# Patient Record
Sex: Female | Born: 1975
Health system: Southern US, Community
[De-identification: ages and names within clinical notes are randomized; demographics above are authoritative.]

## PROBLEM LIST (undated history)

## (undated) DIAGNOSIS — G8220 Paraplegia, unspecified: Secondary | ICD-10-CM

## (undated) DIAGNOSIS — G43909 Migraine, unspecified, not intractable, without status migrainosus: Secondary | ICD-10-CM

## (undated) DIAGNOSIS — N3289 Other specified disorders of bladder: Secondary | ICD-10-CM

## (undated) DIAGNOSIS — R51 Headache: Secondary | ICD-10-CM

## (undated) DIAGNOSIS — Z8614 Personal history of Methicillin resistant Staphylococcus aureus infection: Secondary | ICD-10-CM

## (undated) DIAGNOSIS — K592 Neurogenic bowel, not elsewhere classified: Secondary | ICD-10-CM

## (undated) DIAGNOSIS — I10 Essential (primary) hypertension: Secondary | ICD-10-CM

## (undated) DIAGNOSIS — R200 Anesthesia of skin: Secondary | ICD-10-CM

## (undated) DIAGNOSIS — F32A Depression, unspecified: Secondary | ICD-10-CM

## (undated) DIAGNOSIS — IMO0002 Reserved for concepts with insufficient information to code with codable children: Secondary | ICD-10-CM

## (undated) DIAGNOSIS — F329 Major depressive disorder, single episode, unspecified: Secondary | ICD-10-CM

## (undated) DIAGNOSIS — R609 Edema, unspecified: Secondary | ICD-10-CM

## (undated) DIAGNOSIS — M549 Dorsalgia, unspecified: Secondary | ICD-10-CM

## (undated) DIAGNOSIS — R202 Paresthesia of skin: Secondary | ICD-10-CM

## (undated) DIAGNOSIS — G039 Meningitis, unspecified: Secondary | ICD-10-CM

## (undated) DIAGNOSIS — D509 Iron deficiency anemia, unspecified: Secondary | ICD-10-CM

## (undated) DIAGNOSIS — F1120 Opioid dependence, uncomplicated: Secondary | ICD-10-CM

## (undated) DIAGNOSIS — R600 Localized edema: Secondary | ICD-10-CM

## (undated) DIAGNOSIS — G8929 Other chronic pain: Secondary | ICD-10-CM

## (undated) DIAGNOSIS — Z789 Other specified health status: Secondary | ICD-10-CM

## (undated) DIAGNOSIS — S0300XA Dislocation of jaw, unspecified side, initial encounter: Secondary | ICD-10-CM

## (undated) DIAGNOSIS — N319 Neuromuscular dysfunction of bladder, unspecified: Secondary | ICD-10-CM

## (undated) DIAGNOSIS — Z87898 Personal history of other specified conditions: Secondary | ICD-10-CM

## (undated) DIAGNOSIS — R339 Retention of urine, unspecified: Secondary | ICD-10-CM

## (undated) DIAGNOSIS — M542 Cervicalgia: Secondary | ICD-10-CM

## (undated) DIAGNOSIS — G4700 Insomnia, unspecified: Secondary | ICD-10-CM

## (undated) DIAGNOSIS — N3941 Urge incontinence: Secondary | ICD-10-CM

## (undated) HISTORY — PX: OTHER SURGICAL HISTORY: SHX169

---

## 1997-10-27 ENCOUNTER — Other Ambulatory Visit: Admission: RE | Admit: 1997-10-27 | Discharge: 1997-10-27 | Payer: Self-pay | Admitting: Gynecology

## 1999-01-27 ENCOUNTER — Other Ambulatory Visit: Admission: RE | Admit: 1999-01-27 | Discharge: 1999-01-27 | Payer: Self-pay | Admitting: Gynecology

## 2000-01-31 ENCOUNTER — Other Ambulatory Visit: Admission: RE | Admit: 2000-01-31 | Discharge: 2000-01-31 | Payer: Self-pay | Admitting: Gynecology

## 2000-01-31 ENCOUNTER — Other Ambulatory Visit: Admission: RE | Admit: 2000-01-31 | Discharge: 2000-01-31 | Payer: Self-pay | Admitting: Obstetrics and Gynecology

## 2001-03-10 ENCOUNTER — Other Ambulatory Visit: Admission: RE | Admit: 2001-03-10 | Discharge: 2001-03-10 | Payer: Self-pay | Admitting: Obstetrics and Gynecology

## 2002-04-07 ENCOUNTER — Other Ambulatory Visit: Admission: RE | Admit: 2002-04-07 | Discharge: 2002-04-07 | Payer: Self-pay | Admitting: Obstetrics and Gynecology

## 2002-04-13 ENCOUNTER — Encounter: Payer: Self-pay | Admitting: Obstetrics and Gynecology

## 2002-04-13 ENCOUNTER — Encounter: Admission: RE | Admit: 2002-04-13 | Discharge: 2002-04-13 | Payer: Self-pay | Admitting: Obstetrics and Gynecology

## 2002-10-06 ENCOUNTER — Other Ambulatory Visit: Admission: RE | Admit: 2002-10-06 | Discharge: 2002-10-06 | Payer: Self-pay | Admitting: Obstetrics and Gynecology

## 2002-10-14 ENCOUNTER — Other Ambulatory Visit: Admission: RE | Admit: 2002-10-14 | Discharge: 2002-10-14 | Payer: Self-pay | Admitting: Obstetrics and Gynecology

## 2003-05-25 ENCOUNTER — Other Ambulatory Visit: Admission: RE | Admit: 2003-05-25 | Discharge: 2003-05-25 | Payer: Self-pay | Admitting: Obstetrics and Gynecology

## 2003-06-03 ENCOUNTER — Inpatient Hospital Stay (HOSPITAL_COMMUNITY): Admission: AD | Admit: 2003-06-03 | Discharge: 2003-06-03 | Payer: Self-pay | Admitting: Obstetrics & Gynecology

## 2003-06-09 ENCOUNTER — Inpatient Hospital Stay (HOSPITAL_COMMUNITY): Admission: AD | Admit: 2003-06-09 | Discharge: 2003-06-09 | Payer: Self-pay | Admitting: Obstetrics and Gynecology

## 2003-06-19 DIAGNOSIS — G8220 Paraplegia, unspecified: Secondary | ICD-10-CM

## 2003-06-19 DIAGNOSIS — N319 Neuromuscular dysfunction of bladder, unspecified: Secondary | ICD-10-CM

## 2003-06-19 DIAGNOSIS — G8929 Other chronic pain: Secondary | ICD-10-CM

## 2003-06-19 DIAGNOSIS — Z8614 Personal history of Methicillin resistant Staphylococcus aureus infection: Secondary | ICD-10-CM

## 2003-06-19 DIAGNOSIS — K592 Neurogenic bowel, not elsewhere classified: Secondary | ICD-10-CM

## 2003-06-19 DIAGNOSIS — IMO0002 Reserved for concepts with insufficient information to code with codable children: Secondary | ICD-10-CM

## 2003-06-19 DIAGNOSIS — R200 Anesthesia of skin: Secondary | ICD-10-CM

## 2003-06-19 DIAGNOSIS — N3941 Urge incontinence: Secondary | ICD-10-CM

## 2003-06-19 HISTORY — DX: Anesthesia of skin: R20.0

## 2003-06-19 HISTORY — DX: Neuromuscular dysfunction of bladder, unspecified: N31.9

## 2003-06-19 HISTORY — DX: Reserved for concepts with insufficient information to code with codable children: IMO0002

## 2003-06-19 HISTORY — PX: OTHER SURGICAL HISTORY: SHX169

## 2003-06-19 HISTORY — DX: Urge incontinence: N39.41

## 2003-06-19 HISTORY — DX: Personal history of Methicillin resistant Staphylococcus aureus infection: Z86.14

## 2003-06-19 HISTORY — DX: Other chronic pain: G89.29

## 2003-06-19 HISTORY — DX: Paraplegia, unspecified: G82.20

## 2003-06-19 HISTORY — DX: Neurogenic bowel, not elsewhere classified: K59.2

## 2003-06-30 ENCOUNTER — Inpatient Hospital Stay (HOSPITAL_COMMUNITY): Admission: AD | Admit: 2003-06-30 | Discharge: 2003-06-30 | Payer: Self-pay | Admitting: Obstetrics and Gynecology

## 2003-07-15 ENCOUNTER — Encounter: Admission: RE | Admit: 2003-07-15 | Discharge: 2003-10-13 | Payer: Self-pay | Admitting: Obstetrics and Gynecology

## 2003-07-17 ENCOUNTER — Inpatient Hospital Stay (HOSPITAL_COMMUNITY): Admission: AD | Admit: 2003-07-17 | Discharge: 2003-07-19 | Payer: Self-pay | Admitting: Obstetrics and Gynecology

## 2003-10-06 ENCOUNTER — Other Ambulatory Visit: Admission: RE | Admit: 2003-10-06 | Discharge: 2003-10-06 | Payer: Self-pay | Admitting: Obstetrics and Gynecology

## 2003-10-28 ENCOUNTER — Encounter: Admission: RE | Admit: 2003-10-28 | Discharge: 2003-10-28 | Payer: Self-pay | Admitting: Obstetrics and Gynecology

## 2003-11-03 ENCOUNTER — Inpatient Hospital Stay (HOSPITAL_COMMUNITY): Admission: AD | Admit: 2003-11-03 | Discharge: 2003-11-03 | Payer: Self-pay | Admitting: Obstetrics and Gynecology

## 2003-11-03 ENCOUNTER — Encounter: Admission: RE | Admit: 2003-11-03 | Discharge: 2003-11-03 | Payer: Self-pay | Admitting: Obstetrics and Gynecology

## 2003-11-09 ENCOUNTER — Inpatient Hospital Stay (HOSPITAL_COMMUNITY): Admission: AD | Admit: 2003-11-09 | Discharge: 2003-11-09 | Payer: Self-pay | Admitting: Obstetrics and Gynecology

## 2003-11-10 ENCOUNTER — Encounter: Admission: RE | Admit: 2003-11-10 | Discharge: 2003-11-10 | Payer: Self-pay | Admitting: Obstetrics and Gynecology

## 2003-11-17 ENCOUNTER — Encounter: Admission: RE | Admit: 2003-11-17 | Discharge: 2003-11-17 | Payer: Self-pay | Admitting: Obstetrics and Gynecology

## 2003-11-20 ENCOUNTER — Inpatient Hospital Stay (HOSPITAL_COMMUNITY): Admission: AD | Admit: 2003-11-20 | Discharge: 2003-12-08 | Payer: Self-pay | Admitting: Obstetrics and Gynecology

## 2003-11-24 ENCOUNTER — Encounter: Payer: Self-pay | Admitting: Neurosurgery

## 2003-11-24 ENCOUNTER — Encounter (INDEPENDENT_AMBULATORY_CARE_PROVIDER_SITE_OTHER): Payer: Self-pay | Admitting: *Deleted

## 2003-11-25 ENCOUNTER — Encounter (INDEPENDENT_AMBULATORY_CARE_PROVIDER_SITE_OTHER): Payer: Self-pay | Admitting: *Deleted

## 2003-11-25 ENCOUNTER — Encounter: Payer: Self-pay | Admitting: Neurology

## 2003-11-25 HISTORY — PX: THORACIC LAMINECTOMY: SHX96

## 2003-11-27 ENCOUNTER — Encounter: Admission: RE | Admit: 2003-11-27 | Discharge: 2003-12-27 | Payer: Self-pay | Admitting: Obstetrics and Gynecology

## 2003-12-14 ENCOUNTER — Inpatient Hospital Stay (HOSPITAL_COMMUNITY): Admission: AD | Admit: 2003-12-14 | Discharge: 2003-12-27 | Payer: Self-pay | Admitting: Neurological Surgery

## 2003-12-27 ENCOUNTER — Inpatient Hospital Stay (HOSPITAL_COMMUNITY)
Admission: RE | Admit: 2003-12-27 | Discharge: 2004-01-25 | Payer: Self-pay | Admitting: Physical Medicine & Rehabilitation

## 2004-02-06 ENCOUNTER — Emergency Department (HOSPITAL_COMMUNITY): Admission: EM | Admit: 2004-02-06 | Discharge: 2004-02-06 | Payer: Self-pay | Admitting: Emergency Medicine

## 2004-02-07 ENCOUNTER — Inpatient Hospital Stay (HOSPITAL_COMMUNITY): Admission: EM | Admit: 2004-02-07 | Discharge: 2004-02-10 | Payer: Self-pay | Admitting: Emergency Medicine

## 2004-02-08 ENCOUNTER — Encounter (INDEPENDENT_AMBULATORY_CARE_PROVIDER_SITE_OTHER): Payer: Self-pay | Admitting: Cardiology

## 2004-02-15 ENCOUNTER — Other Ambulatory Visit: Admission: RE | Admit: 2004-02-15 | Discharge: 2004-02-15 | Payer: Self-pay | Admitting: Obstetrics and Gynecology

## 2004-02-29 ENCOUNTER — Encounter: Admission: RE | Admit: 2004-02-29 | Discharge: 2004-02-29 | Payer: Self-pay | Admitting: Neurosurgery

## 2004-03-02 ENCOUNTER — Encounter
Admission: RE | Admit: 2004-03-02 | Discharge: 2004-05-02 | Payer: Self-pay | Admitting: Physical Medicine & Rehabilitation

## 2004-03-03 ENCOUNTER — Ambulatory Visit: Payer: Self-pay | Admitting: Physical Medicine & Rehabilitation

## 2004-05-02 ENCOUNTER — Encounter
Admission: RE | Admit: 2004-05-02 | Discharge: 2004-07-31 | Payer: Self-pay | Admitting: Physical Medicine & Rehabilitation

## 2004-05-14 ENCOUNTER — Encounter: Admission: RE | Admit: 2004-05-14 | Discharge: 2004-05-14 | Payer: Self-pay | Admitting: Internal Medicine

## 2004-06-18 DIAGNOSIS — Z87898 Personal history of other specified conditions: Secondary | ICD-10-CM

## 2004-06-18 HISTORY — DX: Personal history of other specified conditions: Z87.898

## 2004-07-20 ENCOUNTER — Emergency Department (HOSPITAL_COMMUNITY): Admission: EM | Admit: 2004-07-20 | Discharge: 2004-07-20 | Payer: Self-pay | Admitting: Emergency Medicine

## 2004-07-27 ENCOUNTER — Ambulatory Visit: Payer: Self-pay | Admitting: Physical Medicine & Rehabilitation

## 2004-10-23 ENCOUNTER — Encounter
Admission: RE | Admit: 2004-10-23 | Discharge: 2005-01-21 | Payer: Self-pay | Admitting: Physical Medicine & Rehabilitation

## 2004-10-25 ENCOUNTER — Ambulatory Visit: Payer: Self-pay | Admitting: Physical Medicine & Rehabilitation

## 2005-01-21 ENCOUNTER — Encounter: Admission: RE | Admit: 2005-01-21 | Discharge: 2005-01-21 | Payer: Self-pay | Admitting: Neurosurgery

## 2005-01-24 ENCOUNTER — Other Ambulatory Visit: Admission: RE | Admit: 2005-01-24 | Discharge: 2005-01-24 | Payer: Self-pay | Admitting: Family Medicine

## 2005-02-21 ENCOUNTER — Encounter
Admission: RE | Admit: 2005-02-21 | Discharge: 2005-05-22 | Payer: Self-pay | Admitting: Physical Medicine & Rehabilitation

## 2005-02-21 ENCOUNTER — Ambulatory Visit: Payer: Self-pay | Admitting: Physical Medicine & Rehabilitation

## 2005-03-27 ENCOUNTER — Ambulatory Visit (HOSPITAL_BASED_OUTPATIENT_CLINIC_OR_DEPARTMENT_OTHER): Admission: RE | Admit: 2005-03-27 | Discharge: 2005-03-27 | Payer: Self-pay | Admitting: Urology

## 2005-03-27 ENCOUNTER — Ambulatory Visit (HOSPITAL_COMMUNITY): Admission: RE | Admit: 2005-03-27 | Discharge: 2005-03-27 | Payer: Self-pay | Admitting: Urology

## 2005-05-22 ENCOUNTER — Encounter: Admission: RE | Admit: 2005-05-22 | Discharge: 2005-05-22 | Payer: Self-pay | Admitting: Neurology

## 2005-06-21 ENCOUNTER — Ambulatory Visit: Payer: Self-pay | Admitting: Physical Medicine & Rehabilitation

## 2005-06-21 ENCOUNTER — Encounter
Admission: RE | Admit: 2005-06-21 | Discharge: 2005-09-19 | Payer: Self-pay | Admitting: Physical Medicine & Rehabilitation

## 2005-10-16 ENCOUNTER — Encounter: Admission: RE | Admit: 2005-10-16 | Discharge: 2005-10-16 | Payer: Self-pay | Admitting: Neurology

## 2005-11-06 ENCOUNTER — Ambulatory Visit (HOSPITAL_BASED_OUTPATIENT_CLINIC_OR_DEPARTMENT_OTHER): Admission: RE | Admit: 2005-11-06 | Discharge: 2005-11-06 | Payer: Self-pay | Admitting: Urology

## 2005-11-15 ENCOUNTER — Ambulatory Visit: Payer: Self-pay | Admitting: Physical Medicine & Rehabilitation

## 2005-11-15 ENCOUNTER — Encounter
Admission: RE | Admit: 2005-11-15 | Discharge: 2006-02-13 | Payer: Self-pay | Admitting: Physical Medicine & Rehabilitation

## 2006-05-06 ENCOUNTER — Encounter
Admission: RE | Admit: 2006-05-06 | Discharge: 2006-08-04 | Payer: Self-pay | Admitting: Physical Medicine & Rehabilitation

## 2006-09-24 ENCOUNTER — Ambulatory Visit (HOSPITAL_BASED_OUTPATIENT_CLINIC_OR_DEPARTMENT_OTHER): Admission: RE | Admit: 2006-09-24 | Discharge: 2006-09-24 | Payer: Self-pay | Admitting: Urology

## 2007-05-01 ENCOUNTER — Other Ambulatory Visit: Admission: RE | Admit: 2007-05-01 | Discharge: 2007-05-01 | Payer: Self-pay | Admitting: Family Medicine

## 2008-01-08 ENCOUNTER — Ambulatory Visit (HOSPITAL_BASED_OUTPATIENT_CLINIC_OR_DEPARTMENT_OTHER): Admission: RE | Admit: 2008-01-08 | Discharge: 2008-01-08 | Payer: Self-pay | Admitting: Urology

## 2008-05-04 ENCOUNTER — Other Ambulatory Visit: Admission: RE | Admit: 2008-05-04 | Discharge: 2008-05-04 | Payer: Self-pay | Admitting: Family Medicine

## 2008-10-25 ENCOUNTER — Ambulatory Visit (HOSPITAL_BASED_OUTPATIENT_CLINIC_OR_DEPARTMENT_OTHER): Admission: RE | Admit: 2008-10-25 | Discharge: 2008-10-25 | Payer: Self-pay | Admitting: Urology

## 2009-01-04 ENCOUNTER — Encounter: Admission: RE | Admit: 2009-01-04 | Discharge: 2009-01-04 | Payer: Self-pay | Admitting: Psychiatry

## 2009-09-20 ENCOUNTER — Ambulatory Visit (HOSPITAL_BASED_OUTPATIENT_CLINIC_OR_DEPARTMENT_OTHER): Admission: RE | Admit: 2009-09-20 | Discharge: 2009-09-20 | Payer: Self-pay | Admitting: Urology

## 2009-10-21 ENCOUNTER — Ambulatory Visit (HOSPITAL_BASED_OUTPATIENT_CLINIC_OR_DEPARTMENT_OTHER): Admission: RE | Admit: 2009-10-21 | Discharge: 2009-10-21 | Payer: Self-pay | Admitting: Urology

## 2009-12-12 ENCOUNTER — Encounter: Admission: RE | Admit: 2009-12-12 | Discharge: 2009-12-12 | Payer: Self-pay | Admitting: Family Medicine

## 2010-07-09 ENCOUNTER — Encounter: Payer: Self-pay | Admitting: Obstetrics and Gynecology

## 2010-09-05 LAB — POCT HEMOGLOBIN-HEMACUE: Hemoglobin: 12.2 g/dL (ref 12.0–15.0)

## 2010-10-31 NOTE — Op Note (Signed)
Melissa Mills, Melissa Mills               ACCOUNT NO.:  [id number]   MEDICAL RECORD NO.:  [id number]          PATIENT TYPE:  AMB   LOCATION:  [facility]                         FACILITY:  [facility]   PHYSICIAN:  Martina Sinner, MD DATE OF BIRTH:  19-Jun-1975   DATE OF PROCEDURE:  DATE OF DISCHARGE:                               OPERATIVE REPORT   PREOPERATIVE DIAGNOSES:  Neurogenic bladder, refractory urge  incontinence.   POSTOPERATIVE DIAGNOSES:  Neurogenic bladder, refractory urge  incontinence.   SURGERY:  Cystoscopy, hydrodistention, Botox injection therapy.   [name] has a neurogenic bladder with refractory urge incontinence.  She responds beautifully to Botox 400 units.  She was prepped and draped  in the usual fashion.  Preoperative antibiotics were given.  ACMI scope  was utilized.  Bladder mucosa and trigone were normal.  There was no  stitch, foreign body or carcinoma.  Trigone was identified.  I injected  with the bladder partially full.  I injected 400 units of Botox in 40 mL  of saline.  I used my usual template.  I injected at 5 and 7 o'clock and  cephalad to the interureteric ridge in the bottom half of the bladder.  Procedure went very well.  There was no bleeding.  The bladder was  emptied.  The patient was taken to the recovery room.           ______________________________  Martina Sinner, MD  Electronically Signed     SAM/MEDQ  D:  10/25/2008  T:  10/25/2008  Job:  [id number]

## 2010-10-31 NOTE — Op Note (Signed)
[name], [name]               ACCOUNT NO.:  [id number]   MEDICAL RECORD NO.:  [id number]          PATIENT TYPE:  AMB   LOCATION:  [facility]                         FACILITY:  [facility]   PHYSICIAN:  Martina Sinner, MD DATE OF BIRTH:  [birth year]   DATE OF PROCEDURE:  DATE OF DISCHARGE:                               OPERATIVE REPORT   PREOPERATIVE DIAGNOSES:  1. Muscle spasm, neurogenic.  2. Bladder urge incontinence.   POSTOPERATIVE DIAGNOSES:  1. Muscle spasm, neurogenic.  2. Bladder urge incontinence.   PROCEDURE:  1. Cystoscopy.  2. Hydrodistention.  3. Injection of Botox.   PROCEDURE IN DETAIL:  Melissa Mills's neurogenic bladder  __________beautifully with Botox.  She has had over a 12 month benefit  in the last injection.   The patient was prepped and draped in the usual fashion.  She was given  preoperative antibiotics.  ACMI scope was utilized.  The bladder was  hydrodistended to 550 ml.  There was no glomerulations.  With the  bladder partially full, I injected 400 units of Botox and 40 ml in on  the saline using my usual template at 5 and 7 o'clock and cephalad to  the interureteric ridge.  There was no bleeding.  The bladder was empty  and the patient was taken to the recovery room in excellent condition.           ______________________________  Martina Sinner, MD  Electronically Signed     SAM/MEDQ  D:  01/08/2008  T:  01/08/2008  Job:  [id number]

## 2010-11-03 NOTE — Discharge Summary (Signed)
NAME:  Melissa Mills, Melissa Mills                         ACCOUNT NO.:  [id number]   MEDICAL RECORD NO.:  [id number]                   PATIENT TYPE:  INP   LOCATION:  3037                                 FACILITY:  [facility]   PHYSICIAN:  Reinaldo Meeker, M.D.              DATE OF BIRTH:  Sep 06, 1975   DATE OF ADMISSION:  12/14/2003  DATE OF DISCHARGE:  12/27/2003                                 DISCHARGE SUMMARY   PRIMARY DIAGNOSIS:  Thoracic wound infection.   PRIMARY OPERATIVE PROCEDURE:  Re-exploration of thoracic wound with  irrigation and debridement.   HISTORY:  Melissa Mills is a 35 year old female who was admitted at this time  with suspicion of a thoracic wound infection.  She has had surgery  approximately three weeks earlier for a thoracic subdural hematoma secondary  to HELLP syndrome after the delivery of her twins.  She had been improving  and gone to [facility], but had some draining of the wound.  She was therefore readmitted at this time for evaluation.  Dr. Marikay Alar  admitted the patient.  In his evaluation he felt that it was suspicious for  wound infection.  He took her to the operating room and did re-exploration  of the wound with irrigation and debridement, and reclosure.  Infectious  disease consultation was obtained.  Antibiotic recommendations were made and  these were started.  PICC line was placed as well.  Subsequently, the  patient was able to increase her activities as tolerated and appeared to be  doing well.  After approximately two weeks in the hospital, the patient  elected rather than return back to [facility] to continue in rehab at  [facility].  She is therefore transferred up to the  fourth floor for aggressive inpatient rehab and her condition is markedly  improved versus admission.                                                Reinaldo Meeker, M.D.    ROK/MEDQ  D:  02/24/2004  T:  02/25/2004  Job:  [id number]

## 2010-11-03 NOTE — Op Note (Signed)
Melissa Mills, Melissa Mills               ACCOUNT NO.:  [id number]   MEDICAL RECORD NO.:  [id number]          PATIENT TYPE:  AMB   LOCATION:  [facility]                         FACILITY:  [facility]   PHYSICIAN:  Martina Sinner, MD DATE OF BIRTH:  1975/12/29   DATE OF PROCEDURE:  11/06/2005  DATE OF DISCHARGE:  11/06/2005                                 OPERATIVE REPORT   PREOPERATIVE DIAGNOSIS:  Muscle spasm, neurogenic bladder, urge  incontinence.   OPERATION PERFORMED:  Cystoscopy, hydrodistention, Botox injection therapy.   INDICATIONS FOR PROCEDURE:  Melissa Mills has a neurogenic bladder.  She has  responded well to Botox therapy in the past.  She is now incontinent.   DESCRIPTION OF PROCEDURE:  The patient was prepped and draped in the usual  fashion.  The collagen injection scope was used for examination.  The  bladder mucosa and trigone were normal.  She was hydrodistended with 600 mL.  I then injected 200 mL of Botox instilled in 20 mL of saline throughout 20  sites of the bladder.  Using the usual template at 5 and 10 o'clock and in  the midline.  There was little to no bleeding.  The patient tolerated the  procedure very well.  The patient was taken to the recovery room, was  covered with perioperative antibiotics.           ______________________________  Martina Sinner, MD  Electronically Signed     SAM/MEDQ  D:  11/21/2005  T:  11/21/2005  Job:  [id number]

## 2010-11-03 NOTE — Assessment & Plan Note (Signed)
Melissa Mills returns to the clinic today for followup evaluation.  She comes  into the clinic and was filling out paper work in the office.  She  subsequently reported some vision changes, and her blood pressure was noted  to be elevated at 134/107, and a repeat reading of 144/116.  She reports  that she generally has periodic readings when her blood pressure is elevated  and other times when it is low.  She is really not concerned with that at  the present time.  We did place her in a room with the lights low, and she  reported that her vision changes resolved.  She reports that this occurs on  a periodic basis, and she has talked about it with Dr. Foy Mills, her primary  care physician.  The patient reports that her main problem in the office today is that she  continues to experience difficulty eating and this is all secondary to a  decreased taste that she has in her mouth.  She also reports that even plain  water has an abnormal taste.  She is convinced that she has some type of a  sinus infection.  Her primary care physician has sent her to Dr. Anne Mills a  local neurologist, and she apparently is scheduled for an MRI of her face  and head at [facility] today.  The patient reports that she previously was placed on Lasix which she took  approximately five days since August, when she was released from the  hospital.  She reports that that was when her blood pressure was elevated at  200/140.  I do have a note from her therapist that indicates that she is  making excellent progress.  She is apparently independent with transfers at  the present time.  She ambulate with a single-point cane with 1+ minimal  assist in 600 feet with one rest break on smooth surfaces.  She is able to  do straight leg raises on the left for 14 seconds and on the right for 26  seconds.  She is making excellent progress overall in terms of her mobility  and ADLs.  She requested a slip be sent for continued therapy as  her  insurance is interested in discontinuing therapy in the near future.  In terms of her bladder, she is doing in-and-out catheterization  approximately four times per day.  She does report that her intake of fluid  is less than optimal and that apparently has effected her bowels.  She  reports that she has had no bowel movement for the last one and a half weeks  despite taking a Fleet's enema and Enemeez.  She reports that she takes two  Dulcolax tabs on a daily basis but again has poor bowel results.  She also  reports that her p.o. intake of food is poor with approximately one pack of  instant grits twice a day.   MEDICATIONS:  1.  Wellbutrin 300 mg every day.  2.  Baclofen 20 mg t.i.d.  3.  Zanaflex 4 mg t.i.d.  4.  OxyContin 10 mg p.r.n.  5.  Lidoderm patch on 12 hours and off 12 hours p.r.n.  6.  Zofran 4 mg b.i.d. p.r.n.  7.  Senokot two tablets every day p.r.n.  8.  Serzone 300 mg b.i.d.   PHYSICAL EXAMINATION:  GENERAL:  A reasonably well-appearing adult female  initially found lying on the exam table but then able to sit up without  adverse side effects.  VITAL SIGNS:  She has a blood pressure of 134/107 with a pulse of 88,  respiratory rate is 18, and O2 saturation 100% on room air.  MUSCULOSKELETAL:  She shows 3-/5 strength at hip flexor on the right and 4/5  strength in hip flexion on the left.  Knee extension was 4/5 bilaterally.  The patient was able to stand and move into a standard wheelchair with  standby assistance.  We did not try to ambulate her in the office today.  She does report decreased sensation of her right lower extremity, worse than  her left.   IMPRESSION:  1.  T12 incomplete paraplegia secondary to subdural hematoma and subsequent      epidural abscess.  2.  Status post thoracic methicillin-resistant Staphylococcus aureus      infection, __________  antibiotics.  3.  Depression/anxiety.  4.  History of insomnia.   PLAN:  1.  In the clinic  today, we did refill her oxycodone medication which she      uses for her back pain.  2.  I have also asked that physical therapist continue the outpatient      occupational and physical therapy as she is making excellent progress      overall.  She is still only approximately three months from her initial      injury and should eventually get to the point that she needed no      assistive device for at least moderate distance ambulation.  3.  In terms of her bowel and bladder function.  I have asked her to      increase her p.o. intake especially of liquids.  I understand that she      may be experiencing sinusitis problems and that probably has resulted in      some abnormal taste which interferes with her p.o. intake.  I have asked      her to at least try to increase her fluids if not her solid foods.  I      have asked her to be in touch with [place] over the next week or so if her      treating physicians do not start her on an antibiotic.  It may be      reasonable to at least start her on an antibiotic for presumed sinusitis      at this point if nothing else shows up on the MRI scan being completed      today.  4.  We will plan on seeing the patient in followup in this office in      approximately two months' time.       DC/MedQ  D:  05/03/2004 16:20:51  T:  05/03/2004 18:57:30  Job #:  [id number]

## 2010-11-03 NOTE — Assessment & Plan Note (Signed)
HISTORY OF PRESENT ILLNESS:  Melissa Mills returns to the clinic today for  follow up evaluation.  She reports that overall she is doing about the same.  She does have some family members in her house that are experiencing some  viral infections at the present time.   In terms of her bowels, not much has changed.  She is using mini-enemas on a  regular basis and gets reasonable results.  In terms of her bladder, she did  [facility]  She has  spasms that have responded somewhat to the Botox.  She still cath's herself,  but has less spasms in between and is able to stay dryer.  She still has  found it necessary to continued the Oxytrol patches and the Oxybutynin.   In terms of her pain, she reports that she has most of her pain in her  entire thoracic and lumbar spine.  She uses minimal amounts of Oxycodone,  i.e., one tablet per week.  She does use Baclofen more regularly 20 mg 2-3  times per day and her Xanaflex is 4 mg daily.  She does need a refill on her  Baclofen and Zanaflex in the office today.  She also uses Lidoderm patches  on a periodic basis.   The patient continues to attend outpatient therapy approximately 1-2 times  per week.  She is using a rolling walker, and she has experienced some  reported weakness in her legs recently.  She has also noticed tone that is  increased and occasionally decreased in a regular pattern.   MEDICATIONS:  1.  Zanaflex 4 mg daily.  2.  Baclofen 20 mg b.i.d. to t.i.d.  3.  Oxycodone 5 mg daily p.r.n.  4.  Lidoderm patches p.r.n.  5.  Cymbalta 60 mg daily.  6.  Oxytrol patches two times per week.  7.  Oxybutynin 10 mg daily.  8.  Xanax 0.5 mg t.i.d. p.r.n.  9.  Ambien 10 mg two tablets q.h.s. p.r.n.  10. Depakote 500 mg q.h.s.   REVIEW OF SYSTEMS:  Noncontributory.   PHYSICAL EXAMINATION:  GENERAL APPEARANCE:  Well-appearing, fit, adult  female in mild acute discomfort.  VITAL  SIGNS:  Blood pressure 112/74, pulse 81, respirations 16, O2  saturation 100% on room air.  EXTREMITIES:  Basically on the right lower extremity, she has 3+/5 strength  proximally and distally 3-/5.  She has decreased sensation in the right  lower extremity compared to the left.  The left side has decreased sensation  but has improved strength at approximately 4+/5 in hip flexion, knee  extension and ankle dorsiflexion.   IMPRESSION:  1.  T12 incomplete paraplegia secondary to subdural hematoma and subsequent      epidural abscess.  2.  Neurogenic bowel and bladder secondary to #1.  3.  Methicillin-resistant Staphylococcus aureus infection and recurrence of      decubitus ulcer.  4.  Depression/anxiety, improved.  5.  History of insomnia, improved.   PLAN:  In the office today, we did refill the patient's Oxycodone, Baclofen  and Xanaflex.  She uses minimal amounts of the oxycodone and relies mostly  on the Baclofen and Xanaflex.  She is making reasonable progress overall,  but her bowel and bladder issues continue to be the most troublesome.  She  has made some improvement with her mobility, but still uses a walker for  especially longer distances.   PLAN:  We plan on seeing the  patient in follow up in this office in  approximately five months time.           ______________________________  Ellwood Dense, M.D.     DC/MedQ  D:  06/22/2005 11:47:57  T:  06/22/2005 13:50:21  Job #:  [id number]

## 2010-11-03 NOTE — Op Note (Signed)
NAME:  Melissa Mills, Melissa Mills                         ACCOUNT NO.:  [id number]   MEDICAL RECORD NO.:  [id number]                   PATIENT TYPE:  INP   LOCATION:  3113                                 FACILITY:  [facility]   PHYSICIAN:  Reinaldo Meeker, M.D.              DATE OF BIRTH:  1975/12/22   DATE OF PROCEDURE:  11/25/2003  DATE OF DISCHARGE:                                 OPERATIVE REPORT   PREOPERATIVE DIAGNOSIS:  Thoracic epidural hematoma.   POSTOPERATIVE DIAGNOSIS:  Thoracic subdural hematoma.   PROCEDURE:  T3 to T11 thoracic laminectomy with intradural exploration and  removal and evacuation of epidural hematoma.   SURGEON:  Reinaldo Meeker, M.D.   ASSISTANT:  Tia Alert, M.D.   PROCEDURE IN DETAIL:  After being placed in the prone position, the  patient's back was prepped and draped in the usual sterile fashion.  Localizing x-ray was used prior to incision to identify landmark level.  A  midline incision was made above the spinous processes of T3, T4, T5, and T6.  Using Bovie-cutting current, the incision was carried down to the spinous  processes.  Subperiosteal dissection was then carried out bilaterally on the  spinous processes and lamina.  A self-retaining retractor was placed for  exposure.  At this time, spinous processes and interspinous ligament were  removed.  Using a variety of bone rongeurs, laminectomy was performed at T3,  T4, T5, and T6.  No epidural hematoma was encountered.  One or two  additional levels inferiorly were then removed down to approximately the T11  and still no epidural hematoma was encountered.  It was elected to open the  dura at this time to see if perhaps it was a subdural hematoma.   The dura was then opened in the midline and retracted with Nurolon stitches.  An obvious large subdural hematoma was then encountered and dissected free  of the underlying spinal cord.  The hematoma extended the entire thoracic  spine and the incision  and bone removal were extended inferiorly in a  piecemeal fashion as more hematoma was encountered.  When the bottom of the  hematoma was finally encountered, it was at approximately the T12 level and  one could see the start of the cauda equina.  At this point, the hematoma  appeared to stop though there was some blood in the subarachnoid fluid down  at that level.  At this time, it was felt that the decompression was  complete and the spinal cord had been well decompressed.   Large amounts of irrigation were then carried out.  The dura was then closed  with interrupted Nurolon stitches.  Duragen and tissue were then placed over  the dura followed by Gelfoam.  Due to the patient's clotting abnormalities,  it was elected to leave two epidural drains in spite of the fact that the  dura had been opened during the case.  These were brought in through  separate stab wound incisions and left in the epidural space.  At this time,  large amounts of irrigation were carried out once more and the wound closed  with interrupted Vicryl in the muscle, fascia, subcutaneous, and  subcuticular tissues, and staples on the skin.  Sterile dressing was then  applied.  The patient was extubated and taken to the recovery room in stable  condition.                                              Reinaldo Meeker, M.D.   ROK/MEDQ  D:  11/25/2003  T:  11/26/2003  Job:  [id number]

## 2010-11-03 NOTE — Assessment & Plan Note (Signed)
Ms. Ragen returns to clinic today for follow-up evaluation.  Her main  question in the office today is whether she can go without her ankle foot  orthosis for the right foot drop.  She has been wearing it recently but  feels that she walks better without the brace.  She has asked her therapist  and apparently, according to the patient's therapist, also feels that she  walks better without the brace.  When I did have her place the brace on, she  actually circumducts her right leg with the brace on.  Without the brace,  there was mild hip hike on the right which she can eliminate if she pays  close attention to heel-to-toe movement.  She still slightly external  rotates her right foot to compensate for poor ankle dorsiflexion.  She is  attending therapy once per week.   In terms of bladder, she has constant infections.  She requests a catheter  to be placed for a trip that she is making to [place].  We have  given her a prescription for that.  She continues to be followed by Dr.  Earlene Plater, her urologist.   The patient still has numbness of the entire left leg, although the  sensation of her right leg is good.  She has more weakness on the right leg  compared to the left leg.  She has been back to see Dr. Gerlene Fee, her  neurosurgeon.   MEDICATIONS:  1.  Zanaflex 4 mg daily.  2.  Baclofen 20 mg b.i.d. to t.i.d.  3.  Oxycodone 5 mg rarely used.  4.  Lidoderm patches p.r.n.  5.  Cymbalta 60 mg daily.  6.  Oxytrol patches to times per week.  7.  Oxybutynin 10 mg daily.  8.  Xanax 0.5 mg t.i.d. p.r.n.  9.  Ambien 10 mg two tablets nightly p.r.n.  10. Depakote 500 mg nightly   PHYSICAL EXAMINATION:  GENERAL APPEARANCE:  A well-appearing, fit adult  female in mild acute distress.  VITAL SIGNS:  Blood pressure 113/69 with a pulse of 78, respiratory rate 16,  and O2 saturation 99% on room air.   Strength was 4+/5 in hip flexion and knee extension on the right.  Ankle  dorsiflexion was 3-/5 on the right.   IMPRESSION:  1.  T12 incomplete paraplegia secondary to subdural hematoma and subsequent      epidural abscess.  2.  Neurogenic bowel and bladder secondary to #1.  3.  Thoracic methicillin resistant Staphylococcus aureus infection and      recurrence of decubitus ulcer.  4.  Depression/anxiety, improved.  5.  History of insomnia, stable.   In the office today we did give the patient prescription and supplies for  Foley catheter to be placed for her trip that she is planning tomorrow.  We  have refilled the Baclofen and Zanaflex in the office today.   Concerning her brace, I felt that she could probably go without the ankle  orthosis as she has problems with circumduction with the brace and only  slight external rotation of her right foot during ambulation without the  brace.  Will plan on seeing her in follow-up in approximately four to six  months' time.           ______________________________  Ellwood Dense, M.D.     DC/MedQ  D:  02/22/2005 12:20:13  T:  02/22/2005 14:24:31  Job #:  [id number]

## 2010-11-03 NOTE — H&P (Signed)
NAME:  Melissa Mills, Melissa Mills                         ACCOUNT NO.:  [id number]   MEDICAL RECORD NO.:  [id number]                   PATIENT TYPE:  EMS   LOCATION:  ED                                   FACILITY:  [facility]   PHYSICIAN:  Sherin Quarry, MD                   DATE OF BIRTH:  1975-09-27   DATE OF ADMISSION:  02/07/2004  DATE OF DISCHARGE:                                HISTORY & PHYSICAL   HISTORY OF PRESENT ILLNESS:  Melissa Mills is a 35 year old lady who  developed HELLP syndrome prior to delivery of her child in June of 2003.  As  part of her obstetrical management, epidural anesthesia was placed.  The  patient developed evidence of paraplegia of her lower extremities and  ultimately it was determined that she had a subdural hematoma with  compression of cord.  She underwent a very extensive laminectomy from T3 to  T11 with decompression of the subdural hematoma.  She was transferred to  [facility], but then she developed  fever, back pain and drainage from her incision.  A wound infection was  diagnosed and she returned to [facility].  Cultures of the drainage  were positive for a methicillin-resistant Staphylococcus.  The patient was  placed on intravenous vancomycin and incision and drainage with primary  closure of the back wound was carried out by Dr. Yetta Barre on December 15, 2003.  The patient was eventually transferred to the rehabilitation unit where she  was cared for by Dr. Thomasena Edis.  She was discharged from the rehabilitation  unit on January 25, 2004.  Since her discharge, the patient has remained  nonambulatory.  She is incontinent of urine and does self-catheterization  which is carried out by her husband.  This is done every four to six hours.  She also apparently has very little control over her bowel function.  She  has been chronically nauseated.  Saturday evening she states that she  developed increased right-sided abdominal  pain and probably increased  urinary frequency.  She does not think she had any fever or chills.  She  came to the [facility] Emergency Room on Sunday evening where a CT scan of  the abdomen was performed.  This is said to show left perinephric soft  tissue stranding and fluid inferiorly, as well as a small wedged low density  in the posterior aspect of the mid to lower portion of the left kidney.  Findings were felt to be consistent with bilateral pyelonephritis.  The  patient was given oral ciprofloxacin, but has been unable to take it  secondary to nausea and vomiting.  She returned to Dr. Pablo Lawrence office today  and was appropriately advised to come to the emergency room for intravenous  fluids and intravenous antibiotics.   PAST MEDICAL HISTORY:  Except as noted above, the patient's past medical  history  is remarkable for:  1. Depression.  2. History of allergies, possibly asthma.  She does not use any nebulizer     treatments.   CURRENT MEDICATIONS:  1. Lexapro 10 mg daily.  2. Wellbutrin 150 mg daily.  3. Baclofen 20 mg t.i.d.  4. Zanaflex 4 mg t.i.d.  5. Seroquel 25 mg at bedtime.  6. OxyContin p.r.n.  7. Lidoderm patch 12 hours daily.  8. Oxycodone 5-10 mg every four to six hours p.r.n. for pain.  9. Zofran 4 mg every 12 hours p.r.n. for nausea.   ALLERGIES:  She reportedly allergic to SULFA DRUGS and CODEINE.   PAST SURGICAL HISTORY:  She has not had any other surgical procedures,  except as outlined above.   FAMILY HISTORY:  Noncontributory.   SOCIAL HISTORY:  She does not abuse alcohol or drugs.  She lives with her  husband and children.  She is understandably very depressed secondary to her  disability.   REVIEW OF SYSTEMS:  HEAD:  She denies headache or dizziness.  EYES:  She  denies visual blurring or diplopia.  EARS, NOSE, AND THROAT:  Denies  earache, sinus pain or sore throat.  CHEST:  Denies coughing, wheezing or  chest congestion.  CARDIOVASCULAR:  Denies  orthopnea, PND or ankle edema.  GASTROINTESTINAL:  See above.  GENITOURINARY:  See above.  NEUROLOGIC:  See  above.  ENDOCRINE:  Denies excessive thirst.  Denies heat or cold  intolerance.   PHYSICAL EXAMINATION:  GENERAL APPEARANCE:  She is very alert.  She does not  complain of any pain at this time.  HEENT:  Exam is within normal limits.  CHEST:  Clear to auscultation and percussion.  CARDIOVASCULAR:  Remarkable for a sinus tachycardia.  I do not hear any rubs  or gallops.  ABDOMEN:  Benign.  There are normal bowel sounds without masses or  tenderness.  There is no guarding or rebound.  The bladder feels like it  might be a little bit distended.  NEUROLOGIC:  The patient has evidence of spasticity and hyper-reflexia of  her lower extremities.  She has normal upper extremity strength.  Her left  leg seems to be stronger than her right leg.  Clonus is noted.   IMPRESSION:  1. Probable urinary tract infection with pyelonephritis.  2. Paraplegia with need to do self-catheterization every four hours.  3. History of subdural hemorrhage, status post epidural anesthesia with T3     through T11 laminectomy and clot removal on November 25, 2003.  4. Depression.  5. Chronic nausea.  6. History of hemolysis, elevated liver enzymes, and low platelet count     syndrome with pregnancy in June of 2005.  7. History of asthma.  8. History of methicillin-resistant Staphylococcus abscess complicating     laminectomy in June of 2005.   The patient will be admitted to the hospital for vigorous IV hydration.  Antibiotic therapy will be initiated.  Will plan to use ciprofloxacin.  In  light of the finding of methicillin-resistant Staphylococcus in her back a  couple of months ago, will cover that as well with vancomycin pending  results of urine cultures.  The renal function should be followed closely.  We will continue her usual medications.  Sherin Quarry,  MD    SY/MEDQ  D:  02/07/2004  T:  02/07/2004  Job:  [id number]   cc:   Molly Maduro L. Foy Guadalajara, M.D.  [address] [place]  [place] [address]  Fax: [phone]   Reinaldo Meeker, M.D.  [address]  [place]  [place] [address]  Fax: [phone]   Tia Alert, MD  [address]  [place], [place] [address]  Fax: [phone]

## 2010-11-03 NOTE — Assessment & Plan Note (Signed)
DATE OF EVALUATION:  July 31, 2004.   MEDICAL RECORD NUMBER:  [id number].   DATE OF BIRTH:  Mar 02, 1976.   Melissa Mills returns to the clinic today accompanied by her aunt.  The  patient continues to attend outpatient PT and OT one time a week for each  therapy.  She is walking around her home and up stairs, usually without  device, but does frequently furniture walk.  She does not usually walk  downstairs secondary to the hardware floors and injury potentially after a  fall.   The patient reports that she has had skin breakdown a few weeks ago and has  a half dollar-size lesion on her buttocks, which was approximately 1/2 inch  deep.  This wound required packing.  She was also cultured and found to have  MRSA, which she had had while hospitalized.  She was started as an  outpatient on doxycycline along with Bactroban ointment.  That apparently  improved and has completely healed over at this time.   In terms of her bowel, she reports regular bowel movements using many enemas  which she places in the supine position and then has a bowel movement up on  a toilet.  In terms of her bladder, she uses Enablex at 15 mg in the morning  and 7.5 mg in the evening.  She catheterizes herself every four to six  hours.  She reports no incontinence.  She has had no spontaneous voids, but  has a sensation of fullness with occasional spasms.  She sees Dr. Earlene Plater in  the urological section as an outpatient.  He has performed urodynamic  studies and has placed her on the Enablex.   The patient continues to be followed by her primary care physician, Dr.  Foy Guadalajara.  She was also recently diagnosed with one episode of seizure, which  may have been related to Wellbutrin.  That medication was stopped and she is  presently taking Depakote 500 mg at night.  She also sees [facility] as  an outpatient for psychological assistance.   MEDICATIONS:  1.  Baclofen 20 mg t.i.d.  2.  Zanaflex 4 mg t.i.d.  3.   Oxycodone 5 mg one t.i.d. p.r.n.  4.  Lidoderm patches p.r.n.  5.  Enablex 15 mg in the morning and 7.5 mg q.h.s.  6.  Nortriptyline 100 mg q.h.s.  7.  Depakote 500 mg q.h.s.  8.  Xanax 0.5 mg t.i.d. p.r.n.   PHYSICAL EXAMINATION:  A reasonably well-appearing, adult female seated in a  manual wheelchair.  Her blood pressure was 115/69 with a pulse of 100, a  respiratory rate of 18 and an O2 saturation of 100% on room air.  She has  5/5 strength throughout the bilateral upper extremities.  The left lower  extremity strength was 5/5 in hip flexion and knee extension.  The right  lower extremity strength was 4/5 in hip flexion and 4+/5 in knee extension.  She has decreased sensation in her entire left leg compared to the right and  decreased sensation in her right lower abdomen compared to the left.   IMPRESSION:  1.  T12 incomplete paraplegia secondary to subdural hematoma and subsequent      epidural abscess.  2.  Status post thoracic methicillin-resistant Staphylococcus aureus      infection with recurrence of a decubitus ulcer.  3.  Depression/anxiety.  4.  History of insomnia.   In the office today, we did refill her Baclofen, Zanaflex and  oxycodone  medication.  We also refilled her catheters, which are #16 [place] and she  uses daily with cleaning between uses.  Will plan on see the patient in  followup in this office in approximately three months' time.  She continues  to have problems with her insurance and they will only pay for a limited  number of sessions, approximately 20 of OT and PT yearly.  She reports that  she is due for restart of that yearly anniversary in March when she will  have another 20 sessions.  She is doing only sporadic therapy at this time  to extend out the course of time that she is in therapy.      DC/MedQ  D:  07/31/2004 12:17:17  T:  07/31/2004 13:00:37  Job #:  [id number]

## 2010-11-03 NOTE — Op Note (Signed)
Melissa Mills, Melissa Mills               ACCOUNT NO.:  [id number]   MEDICAL RECORD NO.:  [id number]          PATIENT TYPE:  AMB   LOCATION:  [facility]                         FACILITY:  [facility]   PHYSICIAN:  Martina Sinner, MD DATE OF BIRTH:  [birth year]   DATE OF PROCEDURE:  09/24/2006  DATE OF DISCHARGE:  09/24/2006                               OPERATIVE REPORT   PREOPERATIVE DIAGNOSES:  1. Muscle spasm.  2. Neurogenic bladder.  3. Urge incontinence.   POSTOPERATIVE DIAGNOSES:  1. Muscle spasm.  2. Neurogenic bladder.  3. Urge incontinence.   SURGERY:  Cystoscopy, bladder hydrodistention, injection of botulinum  toxin.   [name] has a neurogenic bladder.  She did very well with botulinum  toxin injection therapy. She started having breakthrough incontinence  after approximately 10 or 11 months.  The patient was prepped and draped  in the usual fashion.  The collagen scope was used for the examination.  The bladder mucosa and trigone were normal.  There was no __________  foreign body or carcinoma.   She was hydrodistended to 600 mL.  On re-examination of the bladder  there was no findings of interstitial cystitis.   Four hundred units of Botox was injected throughout 40 sites at 5 and 7  o'clock as well as many sites cephalad to the interureteric ridge.  There was little to no bleeding.  The patient was sent to the recovery  room after emptying her bladder.           ______________________________  Martina Sinner, MD  Electronically Signed     SAM/MEDQ  D:  09/30/2006  T:  09/30/2006  Job:  [id number]

## 2010-11-03 NOTE — Discharge Summary (Signed)
NAME:  Melissa Mills, Melissa Mills                         ACCOUNT NO.:  [id number]   MEDICAL RECORD NO.:  [id number]                   PATIENT TYPE:  INP   LOCATION:  0363                                 FACILITY:  [facility]   PHYSICIAN:  Melissa L. Ladona Ridgel, MD               DATE OF BIRTH:  21-Apr-1976   DATE OF ADMISSION:  02/07/2004  DATE OF DISCHARGE:  02/10/2004                                 DISCHARGE SUMMARY   PRIMARY CARE PHYSICIAN:  Dr. Dewaine Oats.   SUBSPECIALTY CONSULTANTS:  1. Dr. Aliene Beams with neurosurgery.  2. Dr. Candice Camp with OB/GYN.  3. Dr. Ellwood Dense with rehabilitation.  4. Dr. Gaynelle Arabian with urology.   DISCHARGE DIAGNOSES:  1. Klebsiella pneumoniae urinary tract infection, being treated with Cipro     and sensitive to Cipro.  2. Urinary incontinence, likely related to her spinal cord injury, currently     with an indwelling Foley; to be followed up as an outpatient with Dr.     Earlene Plater for urodynamics.  3. Depression.  The patient will remain on her Seroquel, Lexapro,     Wellbutrin, and restart her Serzone and continue to titrate the dosing so     that her Lexapro will ultimately be discontinued as per Dr. Pablo Lawrence     instructions.  4. Partial paraplegia secondary to an epidural hematoma with subsequent     methicillin-resistant Staphylococcus aureus abscess.  This remains     stable.  She will follow up with Dr. Gerlene Fee and Dr. Thomasena Edis for her     rehabilitation.  5. Hypertension and hypotension.  During the course of this hospitalization     the patient has exhibited periods of hypertension related to aggressive     fluid hydration and with the discontinuation abruptly of intravenous     fluids she did show periods of hypotension which also may be related     partially to her spinal cord injury.  We will encourage this patient to     drink aggressively and report any signs or symptoms of dizziness upon     change of position that may suggest orthostatic  hypotension.  6. Past medical history for asthma.  During this hospitalization there has     not been an issue related to her pulmonary status.  She remains stable     with no addition or changes to regime.   HISTORY OF PRESENT ILLNESS:  The patient is a very pleasant 35 year old  white female who unfortunately has experienced several medical tragedies in  the past couple of months.  She underwent the early delivery of twin  children - one boy, one girl - in June 2005 as a result of experiencing  HELLP syndrome.  During the course of the birth of her children she  underwent an epidural anesthetic block which resulted in an epidural  hematoma.  The epidural hematoma was evacuated and she  unfortunately  suffered a MRSA abscess at the site which also needed to have I&D and be  decompressed.  As a result of her extensive back surgery and the injury  related to the hematoma, she sustained a partial paraplegia.  Initially the  paralysis was complete.  Eventually she gradually recovered some toe  movement and ankle movement, and over the last 4 weeks has participated in  aggressive rehabilitation resulting in the ability to stand with the  assistance of a weight belt and a walker.  She continues to have some  spastic jerking movements in the lower extremities but can willfully move  her legs under passive control.  The patient presented to the emergency room  on the day of admission with symptoms of nausea and vomiting.  This was  related to trying to take oral medications to treat an already-known urinary  tract infection.  She therefore was admitted for oral rehydration and IV  antibiotics.  The course of hospitalization was complicated further by the  complaint of right lower quadrant pain.  Acute abdominal series showed no  obvious intraabdominal process.  Her pain resolved on hospital day #2 with  the institution of her bowel regime which she had not been taking every  night at home.  The  reinstitution of mini enemas and stool softeners as well  as digital rectal stimulation resulted in a large bowel movement and a  subsequent decrease in abdominal pain.  On the day prior to discharge the  patient was started on oral Cipro and was able to keep this down.  She has  been responding very nicely to oral rehydration.  The periods of time when  her blood pressure has remained slightly elevated followed by decreases in  her blood pressure may be related to her hydration status, but also, after  speaking with her neurosurgical service, also may have a component related  to her spinal injury.  We will therefore encourage the patient to keep  herself well rehydrated so that she will not experience sensitivities  related to her hydration status in conjunction with her spinal cord injury.   On the day of discharge her vital signs were stable, temperature was 98.2,  blood pressure was 109/67, pulse was 74, respirations were 18, she is 99% on  room air.  Generally, this is a well-developed, well-nourished white female  in no acute distress who is very pleasant to deal with despite her  circumstances.  She is normocephalic, atraumatic.  Pupils are equal, round,  reactive to light.  Extraocular muscles are intact.  Mucous membranes are  moist.  Her neck is supple.  There is no JVD, no lymph nodes.  Her chest is  clear to auscultation.  There are no rhonchi, rales, or wheezes.  Cardiovascular is regular rate and rhythm; positive S1, S2; no S3, S4; no  murmurs, rubs, or gallops.  Abdomen is soft, nontender, nondistended, with  positive bowel sounds.  Foley catheter is in place and draining yellow  urine.  Extremities show 2+ pulses with fair muscle tone.  She does have  some jerking spasticity which can be stimulated with touch but also is at  rest.  She has no edema.  Neurologically, she is awake, alert, oriented.  Cranial nerves II-XII are intact.  She does have power 3-4/5 in the  lower extremities.  Sensation appears to be intact but she does appear to have  hyperreactive reflexes.   Pertinent laboratory values during the course of this  hospitalization reveal  an admitting white count of 6.9.  At discharge her white count is 4.0 with a  hemoglobin of 9.3 and 27.3, platelet count of 225.  Please note the patient  has had anemia in the past.  Urine cultures have grown Klebsiella pneumoniae  sensitive to Cipro, ceftriaxone, levofloxacin, and Bactrim.  The patient was  initially tachycardic during the course of hospital stay.  Thyroid studies  were undertaken and TSH revealed a TSH total of 4.9, T3 of 153.6 which is  within normal limits.  Her T4, however, was slightly low at 0.78.  The  significance of this in the face of infection and recent stressors are  unclear.  We will recommend that her primary care physician repeat this to  establish whether she has a subclinical hypothyroid condition.  In an  attempt to rule out possible endocarditis as a source for her feeling  increasing fatigued since discharging from rehab and with decreased  appetite, a C reactive protein was checked which was within normal limits at  0.4 and an ESR of 21, within normal limits, going against possible occult  infection.  We did perform a 2-D echocardiogram to assure that the patient  was not having any new cardiac postpartum complications.  Her overall left  ventricular systolic function was normal.  Her left ventricular ejection  fraction was 55-65%, and there was no wall motion abnormality.  They also  did not mention any vegetative masses or any valvular problems.  Blood  cultures during this hospitalization did not show any growth on day #3.   At this time the patient is stable for discharge to home with an indwelling  Foley catheter, to follow up with Dr. Earlene Plater for urodynamics.  She will have  home health care resumed, and will complete a full course of ciprofloxacin.  I have  instructed her to call her primary care physician should she develop  fever, chills, nausea, vomiting, back pain, or a change in urine color.  At  the time of discharge, the patient is stable.                                               Melissa L. Ladona Ridgel, MD    MLT/MEDQ  D:  02/10/2004  T:  02/11/2004  Job:  [id number]   cc:   Windy Fast L. Ovidio Hanger, M.D.  [address]  [place]  [place] [address]  Fax: [phone]   Doris Cheadle. Foy Guadalajara, M.D.  [address] [place]  [place] [address]  Fax: [phone]   Reinaldo Meeker, M.D.  [address]  [place]  [place] [address]  Fax: [phone]   Ellwood Dense, M.D.  [address] [place]  [place] [address]  Fax: [phone]

## 2010-11-03 NOTE — Op Note (Signed)
NAME:  Melissa Mills, Melissa Mills                         ACCOUNT NO.:  [id number]   MEDICAL RECORD NO.:  [id number]                   PATIENT TYPE:  INP   LOCATION:  2303                                 FACILITY:  [facility]   PHYSICIAN:  Tia Alert, MD                  DATE OF BIRTH:  Sep 05, 1975   DATE OF PROCEDURE:  12/15/2003  DATE OF DISCHARGE:                                 OPERATIVE REPORT   PREOPERATIVE DIAGNOSIS:  Thoracic wound infection.   POSTOPERATIVE DIAGNOSIS:  Thoracic wound infection.   PROCEDURE:  Incision, irrigation and debridement of thoracic wound with  primary closure.   SURGEON:  Tia Alert, MD   ANESTHESIA:  General endotracheal anesthesia.   COMPLICATIONS:  None apparent.   INDICATIONS FOR PROCEDURE:  The patient is a 35 year old white female who is  about three weeks status post thoracic laminectomy for a subdural hematoma.  She was at the [facility] when they noticed increasing drainage from her incision.  She then  spiked a temperature to 102.7 and had a 22% bandemia.  She was transferred  for neurosurgical care. Her wound looked quite good, however, it was easy to  express purulent bloody drainage from the superior end of the incision.  Her  white count was high and she continued to spike intermittent fevers.  She  was started on vancomycin and Rocephin.  Infectious Disease examined the  patient and did a gram stain which showed Staph aureus and they recommended  incision and drainage of the incision. Therefore, she after informed consent  is taken to the operating room for irrigation and debridement of her  thoracic wound infection.   DESCRIPTION OF PROCEDURE:  The patient was taken to the operating room and  after induction of adequate general endotracheal anesthesia she was rolled  onto the prone position on chest rolls.  All pressure points were padded.  Her thoracic region was prepped with  Duraprep and then draped in the usual  sterile fashion.  Prior to prepping, I was able to express copious amounts  of purulent drainage from the superior part of the incision.  Her incision  was a little more macerated and red than it was even yesterday.  The  incision was draped in the usual sterile fashion and then opened.  I carried  the incision through the fascia.  The tissues were macerated and edematous.  There was some purulent material which was removed with suction.  I  dissected down until I found the laminectomy and was able to use suction to  remove any macerated tissues from the spine itself.  I then irrigated with  copious amounts of Bacitracin containing saline solution. I then placed a  medium Hemovac drain through a separate stab incision and also placed a 10  flat Jackson-Pratt drain through a separate stab incision. I was  then able  to close the fascia with 0 Vicryl.  I closed the subcutaneous tissues with 2-  0 and 3-0 Vicryl. I closed the skin with Benzoin and Steri-Strips.  I did  close the incision primarily.  The tissues looked clean and had good  bleeding edges and I felt there was a good chance she would heal with  primarily closure of the incision especially on antibiotics and after  irrigation and debridement of the tissues.  The drapes were removed and a  sterile dressing was applied.  The patient was awakened from general  anesthesia and transferred to the recovery room in stable condition. At the  end of the procedure all sponge, needle and instrument counts were correct.                                               Tia Alert, MD    DSJ/MEDQ  D:  12/15/2003  T:  12/15/2003  Job:  [id number]

## 2010-11-03 NOTE — Assessment & Plan Note (Signed)
Ms. Melissa Mills returns to clinic today for follow-up evaluation.  She reports  that she did have a Botox injection last week with Dr. Jacquelyne Mills.  She  reports that that generally helps her and she stays continent in between  catheterizations.  She reports the benefit of the Botox is about four  months.  She did have 400 units at last injection.  She continues to use the  Oxytrol patches two times per week along with the oxybutynin.   In terms of her bowels she is noting some occasional constipation that lasts  for several days.  She does use MiraLAX and extra-strength Senokot on an as  needed basis.  She does need a refill on her Eneneez suppository in the  office today.   In terms of her pain she continues to use the hydrocodone 5/325 anywhere  from one to two tablets b.i.d.  She uses the Baclofen 20 mg two to three  times per day and the Zanaflex at bedtime.  She does need a refill on the  hydrocodone in the office today.   The patient reports that she is working with a lifetime care planner from  [place] that she was set up with through her attorney.  She is in the  process of filing a law suit and needs to have long-term care needs  established.  I would be willing to look at that through the healthcare  manager if they wish me to do so.   MEDICATIONS:  1.  Zanaflex 4 mg q.h.s.  2.  Baclofen 20 mg b.i.d. to t.i.d.  3.  Hydrocodone 5/325 one to two tablets p.o. b.i.d. p.r.n.  4.  Lidoderm patches p.r.n.  5.  Cymbalta 60 mg daily.  6.  Oxytrol patches two times per week.  7.  Oxybutynin 10 mg daily.  8.  Xanax 0.5 mg t.i.d. p.r.n.  9.  Ambien 10 mg two tablets q.h.s. p.r.n.  10. Depakote 500 mg q.h.s.   REVIEW OF SYSTEMS:  Positive for weight gain, constipation, nausea, and self  catheterizations.   PHYSICAL EXAMINATION:  GENERAL:  Well-appearing, fit adult female in mild  acute discomfort.  VITAL SIGNS:  Blood pressure 112/74 with a pulse of 108, respiratory rate  16,  and O2 saturation 97% on room air.  NEUROLOGIC:  Right lower extremity was 3+/5 proximally and 3-/5 distally.  Left-sided strength was 4+/5 in hip flexion, knee extension, ankle  dorsiflexion.   IMPRESSION:  1.  T12 incomplete paraplegia secondary to subdural hematoma and subsequent      epidural abscess.  2.  Neurogenic bowel and bladder secondary to #1.  3.  Methicillin-resistant Staphylococcus aureus infection and recurrence of      decubitus ulcer.  4.  Depression/anxiety, improved.  5.  History of insomnia, improved.  6.  In the office today we did refill the patient's hydrocodone along with      her Eneneez enemas.  Will plan on      seeing the patient in follow-up in approximately six months' time.  Will      plan on reviewing healthcare lifetime plan as she wishes.           ______________________________  Melissa Mills, M.D.     DC/MedQ  D:  11/16/2005 11:52:38  T:  11/16/2005 13:31:27  Job #:  [id number]

## 2010-11-03 NOTE — Discharge Summary (Signed)
NAME:  Melissa Mills, Melissa Mills                         ACCOUNT NO.:  [id number]   MEDICAL RECORD NO.:  [id number]                   PATIENT TYPE:  INP   LOCATION:  3106                                 FACILITY:  [facility]   PHYSICIAN:  Reinaldo Meeker, M.D.              DATE OF BIRTH:  [birth year]   DATE OF ADMISSION:  11/25/2003  DATE OF DISCHARGE:  12/08/2003                                 DISCHARGE SUMMARY   DISCHARGE DIAGNOSIS:  Thoracic subdural hematoma.   PROCEDURE:  T3-T11 laminectomy with evacuation of subdural hematoma.   HOSPITAL COURSE:  Melissa Mills is a 35 year old female who delivered twins on  the day prior to admission.  She was at the [facility].  She had an  epidural catheter placed for anesthesia pain control.  She developed the  HELLP syndrome with elevated liver function tests, hemolytic anemia and low  platelet count.  After the epidural catheter was placed and the babies were  delivered, she was slow to recover her lower extremity function.  Lumbar MRI  scan was obtained on the night of delivery, but showed no abnormalities.  When the patient was still having weakness the next day, a neurology  consultation was obtained.  They noted an ascending deprivation level and  arranged a thoracic MRI scan which showed a diffuse hematoma along her  thoracic spinal cord.  Neurosurgical consultation was obtained at that time  and immediately had her transferred over to [facility] for  evaluation of probable surgical intervention.  Films were reviewed which did  show some sort of hematoma compressing the cord particularly at the T4-T6  level.  At the time of admission, the patient had some position sensation,  but had no voluntary movement of the lower extremities bilaterally.  She did  have a mid thoracic sensory level to her sensation.   The patient was taken to the operating room emergently where she underwent a  T3-T11 laminectomy.  No epidural hematoma was  encountered.  It was elected  to open the dura.  A large subdural hematoma was then encountered which was  delicately peeled away from the underlying spinal dura.  Due to the  patient's low platelet count, she had multiple transfusions of platelets and  packed red cells and at the time of the start of her operation, her platelet  count was 40,000.  With 30 units of platelets at the end of the case, her  count was only up to approximately 70,000.  She continued to have  transfusions during the night to help get her level to a more reasonable  level.  Immediately after surgery, she had some improvement in her  sensation.  Her light touch and pain sensation began to return, particularly  on the left lower extremity more than the right, but the right also improved  as well.  Over the next few days, she began to develop some increasing  motor  function in her lower extremities, particularly on the left, to the point  where she could elevate the leg off the bed, dorsiflex and plantar flex the  ankle.  The right leg was much slower to respond, but she did begin to get  some plantar flexion, some wiggling of her toes and some abduction and  adduction of the hips on that side as well.  Physical therapy and  occupational therapy were consulted and they began to increase her activity  as tolerated.  Her wound healed well.  Her HELLP syndrome did resolved  spontaneously over three to four days to the point where her lower functions  were back to normal and her platelet count was back over 150,000 with no  fluid transfusions needed.  The patient's rehabilitation options were  discussed.  She evaluated the [facility] in [place], [facility] and the [facility] and due to family  reasons and rehabilitation options, they elected to go to the [facility]  at [facility].  On June 22, the patient was steadily improving.  Her  wound looked good and her  stitches were removed.  She as transferred at that  time.   DISCHARGE MEDICATIONS:  1. Decadron 2 mg b.i.d.  2. Pain medication as needed.   CONDITION ON DISCHARGE:  Improved versus admission.                                                Reinaldo Meeker, M.D.    ROK/MEDQ  D:  12/08/2003  T:  12/08/2003  Job:  [id number]

## 2010-11-03 NOTE — H&P (Signed)
NAME:  Melissa Mills, UREY                         ACCOUNT NO.:  [id number]   MEDICAL RECORD NO.:  [id number]                   PATIENT TYPE:  INP   LOCATION:  2303                                 FACILITY:  [facility]   PHYSICIAN:  Tia Alert, MD                  DATE OF BIRTH:  07/15/1975   DATE OF ADMISSION:  12/14/2003  DATE OF DISCHARGE:                                HISTORY & PHYSICAL   CHIEF COMPLAINT:  Thoracic wound infection.   HISTORY OF PRESENT ILLNESS:  Ms. Melissa Mills is a 35 year old white female who  is well known to [place] status post extensive thoracic laminectomy for extensive  subdural hematoma extending from about T4 into the lumbar cistern in early  June.  The patient is postpartum.  She gave birth to twins on November 24, 2003  in which she had HELLP syndrome and had epidural anesthesia which resulted  in a subdural hematoma with compression of cord.  She underwent surgical  removal of this on June 9, I believe.  She did well following her surgery  and was transferred to the [facility] where she  did well for quite some time until she developed fevers, increasing back  pain and drainage from her incision.  She was evaluated by the rehab  physicians there and also by the neurosurgeons.  She was felt to have an  early wound infection and was transferred back to [facility] for  treatment of this.  She has been having improving lower extremity strength  and sensation since the time of the surgery.  An MRI was performed at  [facility] which showed a complex fluid collection with enhancement by  report.   PAST MEDICAL HISTORY:  Depression, asthma and HELLP syndrome.   MEDICATIONS:  Labetalol, Lexapro, Cipro, Decadron, Flexeril, Baclofen,  oxycodone.   ALLERGIES:  CODEINE and SULFA, which cause nausea and vomiting.  ERYTHROMYCIN causes itching.   PHYSICAL EXAMINATION:  VITAL SIGNS:  Her weight is 150 pounds.  Her  temperature max is  102.7.  She is afebrile at this time.  Her vital signs  are stable with systolic blood pressures 85-120 and diastolic blood  pressures in the 50s-70s.  Respirations are 18.  GENERAL:  Very pleasant, cooperative, young white female lying in a  stretcher.  HEENT:  Normocephalic, atraumatic.  Extraocular movements are intact.  NECK:  Nuchal rigidity.  It is nontender to palpation.  HEART:  Regular rate and rhythm.  EXTREMITIES:  No clubbing, cyanosis or edema.  NEUROLOGIC:  She is awake, alert and oriented x4.  No aphasia.  Good  attention span.  Good fund of knowledge.  Good memory.  Cranial nerves II-  XII are intact.  Her strength is good in her upper extremities with good  muscle tone and bulk.  Her left lower extremity is much stronger than her  right lower  extremity.  She had some dorsi and plantar flexion of the right  lower extremity.  She has fairly good dorsi and plantar flexion of the left  lower extremity and she can almost lift the left lower extremity off the  bed.  She can bend her knee now.  She has some increasing sensation in the  lower extremities also.  Reflexes show upgoing toes and a couple of beats of  clonus on the left side and they are fairly brisk at the knee jerks.  SKIN:  She has a long thoracic incision that actually looks quite good.  There is not significant erythema, but there are three areas where it does  have a small opening and drains significant amounts of purulent, bloody  fluid.   ASSESSMENT AND PLAN:  This is a 35 year old white female with a complicated  medical course following birth of twin children which included HELLP  syndrome and a subdural hematoma in the thoracic region causing paraplegia.  She is having improving neurologic exams with time.  However, now I believe  she has an extensive wound infection of the thoracic incision.  We will  place her on vancomycin and Rocephin empirically and get infectious disease  involved.  She will likely  need incision and drainage of the wound to  prevent dehiscence and to hasten recovery from this infection.  We will  evaluate her MRI of her thoracic spine, wean her Decadron and once her  infection has retreated return her to physical and occupational therapy.                                                Tia Alert, MD    DSJ/MEDQ  D:  12/15/2003  T:  12/15/2003  Job:  [id number]
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
## 2010-11-03 NOTE — Assessment & Plan Note (Signed)
MEDICAL RECORD NUMBER:  [id number].   Melissa Mills returns to the clinic today. She is an unfortunate 35 year old  female who apparently delivered twins after an epidural anesthesia  approximately late May to early June 2005. She was subsequently found to  have a subdural hematoma with cord compression. She was admitted to [facility] and underwent T3-T11 laminectomy and decompression November 25, 2003 by Dr. Gerlene Fee. She subsequently was transferred to [facility] December 08, 2003 with persistent paresis.   The patient then transferred back to [facility] hospital December 14, 2003 after  developing fevers with increased back pain. MRA study revealed thoracic  abscess, and the cultures were positive for methicillin-resistant staph  aureus. The patient was started on IV antibiotics including vancomycin. She  had a primary closure by Dr. Yetta Barre December 15, 2003. She was seen by the  infectious disease consult and continued on IV antibiotics.   The patient eventually stabilized and was moved to the rehabilitation unit  December 27, 2003, remained there through discharge January 25, 2004.   The patient was seen in the office today accompanied by her husband and her  two small fraternal twins.   The patient has had persistent spasticity and pain. She has stopped using  the OxyContin medication but has continued to use the oxycodone  approximately two tablets per day. She is really not sure if she is getting  any benefit from the Zanaflex and baclofen medication although she does  continue to take them and request a refill on them in the office today. She  has been back to see her neurosurgeon, Dr. Gerlene Fee, this past Wednesday  after a MRI scan was done Tuesday. Apparently, things are healing although  she has been started on Celebrex medication. She is due to follow up with  Dr. Gerlene Fee in the next two weeks.   In terms of her mobility, she receives home health therapy at the  present  time at approximately two to three times a week with physical therapy. She  reportedly ambulates with a walker less than or equal to 130 feet with min  assist usually, and that occasionally advanced to mod to max assist if she  is feeling weak at the end of the walking session. The ambulation is limited  by the space within her home at the present time where she lives with her  husband. She is reportedly independent with her transfers.   In terms of her bladder function, she initially was doing in and out  catheterizations when she left [facility]. She subsequently was  readmitted for urinary tract infection, and a Foley catheter was placed. She  reports that she has had urodynamic studies with Dr. Earlene Plater and is due to  follow up this coming Monday.   In terms of her bowels, she had been doing reasonably well using Senokot  daily along with mini enema. Unfortunately, over the past week or so, that  has become more of an issue, and she has had no bowel movement for the past  four days despite using the mini enema on a regular basis. We have discussed  that in the office today, but just at this point, she has gone to the  bathroom here in the office and apparently is getting some results. We  discussed possible use of GoLYTELY that she has at home but has really never  used in the past.   She reports that overall she and  her husband are managing at best possible.  She would like to maybe start outpatient therapies if something could be set  up close to her home.   MEDICATIONS:  1.  Wellbutrin 300 mg q.d.  2.  Baclofen 20 mg t.i.d.  3.  Zanaflex 4 mg q.i.d.  4.  Seroquel 50 mg q.h.s.  5.  Oxycodone 5 mg 1 to 2 tablets b.i.d. p.r.n.  6.  Zofran 4 mg b.i.d. p.r.n.  7.  Lidoderm patch p.r.n.  8.  Celebrex 200 mg b.i.d.  9.  Senokot 2 tablets q.d.  10. Serzone 300 mg b.i.d.   PHYSICAL EXAMINATION:  Reasonably well appearing adult female seated in a  manual  wheelchair. Blood pressure was 86/52 with a pulse of 110 and  respiratory rate 20 with O2 saturation 99% on room air. Manual muscle  testing shows 5/5 strength throughout the bilateral upper extremities. Right  lower extremity strength showed hip flexion at 3-/5 and knee extension 3+/5.  Left lower extremity strength was 4/5 in hip flexion and knee extension. She  does have some abnormal tone occasionally in the right lower extremity, and  this appears to come and go depending on particular movement. She does not  ambulate in the office today but is present in the manual wheelchair. We did  not attempt to stand as she was having some trouble with one of the children  crying and needed to get food for the baby.   IMPRESSION:  1.  T12 incomplete paraplegia secondary to subdural hematoma and subsequent      epidural abscess.  2.  Status post thoracic methicillin-resistant staph aureus infection,      completed antibiotics.  3.  Depression/anxiety.  4.  History of insomnia.   At the present time, the patient is doing about as well as can be expected  in terms of managing her home, herself, and the babies. She has decided to  attempt outpatient therapies, and we set her up for that. She would like to  try a center near her home to lessen the time away from her children. We  have set her up for that, and she will be attempting outpatient therapy for  progressive strengthening and progressive ambulation. She has reasonable  potential, especially given the strength that she has in her left lower  extremity. We have refilled her Zanaflex and baclofen medications in the  office today along with her oxycodone medication. If she does have results  in the bathroom of our office today, she will attempt some minimal use of  GoLYTELY at her home. She will continue using the Senokot and the mini enema  that she has been using which apparently has been working well for her until this past week or so.  She will be followed up with Dr. Earlene Plater for management  of her bladder although she has been able to do in and out catheterizations  in the past. She will continue on the antidepressant medication as noted  above. We will plan on seeing her in the office in followup in approximately  two months' time.      Ellwood Dense, M.D.    DC/MedQ  D:  03/03/2004 12:13:07  T:  03/05/2004 08:01:13  Job #:  [id number]

## 2010-11-03 NOTE — Assessment & Plan Note (Signed)
MEDICAL RECORD NUMBER:  [id number].   Melissa Mills returns to clinic today for followup evaluation. She is doing  reasonably well at this time. She comes into the office today using no  assistive device. She does have persistent problems involving the bilateral  lower extremities along with her bowel and bladder which will be noted  below. The patient complains of frequent bladder spasms which she complains  of being severe. She continues to see Dr. Earlene Plater and has undergo urodynamic  studies at least two times. She is diagnosed with a dysreflexic neurogenic  bladder as best I can tell. She is on Oxytrol patches along with oxybutynin  pills on a daily basis. She follows up with Dr. Earlene Plater on a regular basis.   In terms of her bowels, she continues to have urgency and does have  occasional constipation and occasional rare accidents.   Involving her lower extremities, she still has decreased sensation of the  entire left leg with decreased strength of the right leg compared to the  left. She does not use any devices for ambulation on a regular basis but  occasionally uses her wheelchair for longer distance mobility. She still has  problems with ankle dorsi flexion on the right being weak, and this results  in a foot drop which is not always evident unless she is fatigued. She  reports that she tries to get out and do as much as walking as possible and  pushes her babies in a stroller on a regular basis. She continues to attend  outpatient PT approximately once weekly.   In terms of her medications, she continues to take the Zanaflex  approximately three times a day as needed. She takes baclofen twice a day  and takes the oxycodone only as absolutely needed. She takes the Xanax as  needed but has been discontinued from the nortriptyline and the Enablex. She  has been continued on the Depakote and recently started Cymbalta which she  feels has helped her. She is not having outpatient  psychological treatment  at the present time.   MEDICATIONS:  1.  Zanaflex 4 mg t.i.d.  2.  Baclofen 20 mg b.i.d.  3.  Oxycodone 5 mg 1 tablet q.d. p.r.n.  4.  Lidoderm patches p.r.n.  5.  Cymbalta 60 mg q.d.  6.  Oxytrol patches two times a week.  7.  Oxybutynin pill 10 mg q.d.  8.  Xanax 0.5 mg t.i.d. p.r.n.  9.  Ambien 10 mg 2 tablets q.h.s. p.r.n.  10. Depakote 500 mg q.h.s.   PHYSICAL EXAMINATION:  Reasonably well appearing adult female in no acute  discomfort. Blood pressure is 103/56 with a pulse of 89, respiratory rate  16, and O2 saturation 95% on room air. Examination of her left lower  extremities showed decreased sensation throughout. Strength was generally  4+/5 in hip flexion and knee extension along with ankle dorsi flexion. Right  lower extremity was 4/5 in hip flexion and knee extension and 4-/5 in ankle  dorsi flexion. Sensation was slightly decreased to light touch on the right  but much better compared to the left. The patient ambulates with fairly  stiff leg gait on the right with some hip hiking to clear her foot.   IMPRESSION:  1.  T12 incomplete paraplegia secondary to subdural hematoma and subsequent      epidural abscess.  2.  Neurogenic bowel and bladder secondary to #1.  3.  Status post thoracic methicillin-resistant staphylococcus aureus  infection and recurrence of decubitus ulcer, improved.  4.  Depression/anxiety, improved.  5.  History of insomnia, stable.   In the office today, we did refill her oxycodone along with her Zanaflex and  baclofen medications. We gave her refills on her Zanaflex and baclofen but  not on the oxycodone. She continues to do reasonably well and is still only  11 months out from her initial injury after the epidural abscess. We will  plan on seeing her in followup in approximately three to four months' time.      DC/MedQ  D:  10/25/2004 13:35:00  T:  10/25/2004 14:48:30  Job #:  [id number]

## 2010-11-03 NOTE — Op Note (Signed)
Melissa Mills, Melissa Mills               ACCOUNT NO.:  [id number]   MEDICAL RECORD NO.:  [id number]          PATIENT TYPE:  AMB   LOCATION:  [facility]                         FACILITY:  [facility]   PHYSICIAN:  Martina Sinner, MD DATE OF BIRTH:  [birth year]   DATE OF PROCEDURE:  03/27/2005  DATE OF DISCHARGE:                                 OPERATIVE REPORT   DIAGNOSES:  1.  Neurogenic bladder.  2.  Muscle spasm.  3.  Urge incontinence.   SURGERY:  1.  Cystoscopy.  2.  Hydrodistention.  3.  Botox.   INDICATION:  [name] history has been previously well documented.  We  were hoping to help her incontinence with Botox and not do a bladder  augmentation.  Her urine culture that was done when I saw her in the clinic  last week was normal.  She is still on Levaquin.   DESCRIPTION:  The patient was prepped and draped in the usual fashion under  general anesthesia.  The 24-French collagen injection scope was utilized.  I  initially hydrodistended her to 450 mL.  There were no mucosal  abnormalities.  I then injected 300 units of Botox with my usual technique  at 5 and 7 o'clock and up the midline with 30 sites.  I used 30 mL of normal  saline.  There was no bleeding.  The bladder was emptied at the end of the  case.  The procedure was very well tolerated and the patient was sent to  recovery room.           ______________________________  Martina Sinner, MD  Electronically Signed     SAM/MEDQ  D:  03/27/2005  T:  03/27/2005  Job:  [id number]

## 2010-11-03 NOTE — Discharge Summary (Signed)
NAME:  Melissa Mills, Melissa Mills                         ACCOUNT NO.:  [id number]   MEDICAL RECORD NO.:  [id number]                   PATIENT TYPE:  OBV   LOCATION:  9307                                 FACILITY:  [facility]   PHYSICIAN:  Duke Salvia. Marcelle Overlie, M.D.            DATE OF BIRTH:  26-Jun-1975   DATE OF ADMISSION:  07/16/2003  DATE OF DISCHARGE:  07/19/2003                                 DISCHARGE SUMMARY   DISCHARGE DIAGNOSES:  1. 18-week twin intrauterine pregnancy.  2. Hyperemesis.   HISTORY OF PRESENT ILLNESS:  Please see admission H&P for details.  Briefly,  the patient is a 35 year old at [redacted] weeks gestation with twins with continued  problems with persistent nausea and vomiting admitted for IV hydration and  antiemetics.   HOSPITAL COURSE:  This patient was consulted initially by pharmacy and  nutrition, and they had recommended a four drug protocol, including a Medrol  protocol for hyperemesis, p.o. Benadryl, p.o. Phenergan, Pepcid, and Reglan.  She was started on this protocol with significant improvement and tolerating  p.o. and oral fluids.  During that time,  IV fluids were running.  On 1/30,  all of her medications were changed to p.o., and she was tolerating a  regular diet at that point.  She had not had a BM in some three to four days  and was given a laxative on 1/30 when she complained about some mild right  lower quadrant pain.  This produced good results.  WBC was noted to be 8.7,  with a temperature of 99.6 on 07/18/2003.  By the a.m. of 07/19/2003, she was  tolerating a regular diet, felt much improved on this p.o. four-drug  regimen, and was sent home on the same with a tapering of the Medrol per  protocol.  She will return to our office in three days for follow up.   LABORATORY DATA:  Admission UA was negative.  WBC on admission was 8.7,  hemoglobin 9.3, hematocrit 26.2, platelets 239,000.  TSH was normal.  Free  T4 was normal.  T3 was elevated at 200.6, upper limit of  normal was 181.  CMET normal.   DISCHARGE MEDICATIONS:  1. Medrol hyperemesis protocol with taper.  2. Pepcid 20 mg p.o. b.i.d.  3. Phenergan 25 mg p.o. q.6h.  4. Benadryl 25 mg p.o. q.6h.  5. Reglan 10 mg p.o. q.6h.   FOLLOW UP:  She will return to our office in three days, later this week for  followup.   CONDITION ON DISCHARGE:  Good.   ACTIVITY:  Increased.                                               Richard M. Marcelle Overlie, M.D.   RMH/MEDQ  D:  07/19/2003  T:  07/19/2003  Job:  [id number] 

## 2010-11-03 NOTE — Discharge Summary (Signed)
NAME:  Melissa Mills, Melissa Mills                         ACCOUNT NO.:  [id number]   MEDICAL RECORD NO.:  [id number]                   PATIENT TYPE:  IPS   LOCATION:  4010                                 FACILITY:  [facility]   PHYSICIAN:  Ellwood Dense, M.D.                DATE OF BIRTH:  [birth year]   DATE OF ADMISSION:  12/27/2003  DATE OF DISCHARGE:  01/25/2004                                 DISCHARGE SUMMARY   DISCHARGE DIAGNOSES:  1. T12 incomplete paraplegic secondary to subdural hemorrhage in cervical,     thoracic spinal canals status post T3-11 laminectomy and clot removal.  2. Status post thoracic methicillin-resistant Staphylococcus aureus abscess,     completing intravenous antibiotics.  3. Muscle spasms.  4. Depression/anxiety.  5. Anemia.  6. Nausea secondary to medication.  7. Status post hemolysis, elevated liver enzymes, and low platelet count     syndrome.  8. History of insomnia.   HISTORY AND PHYSICAL:  Patient is a 35 year old white female with a past  medical history of HELLP syndrome status post postpartum and delivery of  twins but post delivery patient suffered a subdural hematoma with  compression of cord.  She was admitted to [facility] last week and  underwent a T3-11 laminectomy with decompression on November 25, 2003, by Dr.  Gerlene Fee, and transferred to [facility] on December 08, 2003, with paraparesis.   Now transfers back to [facility] on December 14, 2003, after developing fevers  and increased back pain.  MRA revealed a thoracic abscess.  Cultures were  MRSA positive.  Patient started on IV antibiotics and vancomycin per IV and  underwent I&D with primary closure by Dr. Yetta Barre on December 15, 2003.  Hospital  course also significant for diarrhea, spasms, pain control, pain  intolerance.  Patient started on Flagyl empirically by internist, Dr. Midge Minium,  for presumed C. difficile.  PT report _____ time to get patient to mod  assist, bed mobility  and min-assist transfers, nonambulatory.   REVIEW OF SYSTEMS:  Significant for depression, wound, back pain, weakness,  numbness and chills.  She denies any chest pain, shortness of breath or  cough.   PAST MEDICAL HISTORY:  Significant for:  1. Depression.  2. Asthma.  3. History of migraines.  4. Question of hypertension, only during pregnancy.  5. HELLP syndrome.  6. Allergies.   FAMILY HISTORY:  Noncontributory.   SOCIAL HISTORY:  Patient lives with husband and with her son in [place], two children (newborns).  Works as a Production designer, theatre/television/film at [employer].  Two  level home, all bedrooms on the second floor, one step to entry, supportive  family.   MEDICATIONS PRIOR TO ADMISSION TO HOSPITAL:  Prenatal vitamins and Ambien.   ALLERGIES:  SULFA, CODEINE AND ERYTHROMYCIN.   PRIMARY CARE [facility] [place]:  Dr. Foy Guadalajara.   HOSPITAL COURSE:  Melissa Mills was admitted  to [facility] rehab  department on December 27, 2003, for comprehensive inpatient rehabilitation and  received more than three hours of therapy daily.  Hospital course  significant for:  1. T12 incomplete paraplegia secondary to subdural hemorrhage of cervical     and thoracic spine.  Melissa Mills overall got off to a pretty good start     in rehab and remarkably she was ambulating at least 23 feet with the     rolling walk, mod and min assist.  Patient was able to tolerate therapies     very well.  Patient at time of discharge will have home evaluation per     physical therapy.  Overall, she is discharged at a modified independent     at wheelchair level but patient was able to ambulate at time of     discharge.  Initially patient had PSS hose with DVT prophylaxis.  Once     got approval by neurosurgery, she was placed back on Lovenox for DVT     prophylaxis on December 26, 2003.  The patient's main problem musculoskeletal-     wise was spasms and pain control.  Initially she was placed on baclofen     10 mg p.o. t.i.d. as well  as Valium 2.5 mg q.i.d. and due to still     problems with spasms. Baclofen, Zanaflex also added to control spasms.     For pain control she had OxyContin as well as oxycodone.  Patient still     complaining about mild hypersensitivity around her right flank.  She was     started on a Lidoderm patch a couple of days prior to discharge with     significant improvement.  Spasm did improve with the Zanaflex as well as     the baclofen.  Patient also initially was on ibuprofen to help with pain     but this was discontinued several days prior to discharge.  2. Status post thoracic MRSA abscess:  Patient's surgical incision on the     back healed very well.  There was no significant drainage.  Patient     completed a total of 42 days of IV vancomycin.  Vancomycin was managed     and followed by pharmacy.  3. Depression, anxiety.  Patient remained on Wellbutrin as well as Lexapro     throughout stay for anxiety and depression.  4. GI.  Patient did have a problem with nausea secondary to medication, so     her medication was placed on hold.  Initially she was on Trinsicon one     tablet daily for anemia, this was discontinued.  Ibuprofen in addition     was also discontinued secondary to nausea.  She was started on Zofran     p.r.n.  Eventually, the Zofran had to be scheduled 4 mg p.o. b.i.d.  Once     Zofran was scheduled, the nausea did improve significantly as well.     Bowels and bladder was per R.N.  Patient had to learn how to cath herself     as needed.  She did have better control of bladder.  She did have     occasional incontinent spells.  Patient at time of discharge is only     cathing herself at least once daily.  Patient to make her own bowel     regimen as needed.  5. Pregnancy hypertension.  At time of admission patient, was on Normodyne  200 mg b.i.d.  Blood pressure became stabilized throughout her stay in    rehab, this was discontinued.  Patient is presently on no blood  pressure     medication.  6. Insomnia.  Patient had a history of having problems sleeping.  Initially     the patient was placed on Ambien, it did not work . She was started on     Seroquel 25 mg p.o. q.h.s. and we tried to taper patient off of Seroquel     but patient still states that she had difficulty sleeping without the     Seroquel.  She resumed her home dose of Seroquel at time of discharge as     well.   There was no other medical issues to occur in patient's rehab.  Therapist  recommended that patient stay a couple of days.  Since patient began to  walk, patient stated she preferred to go home.  Therefore, patient was  discharged on January 25, 2004.  There were no other major medical  issues__________ at home except for she is still having anemia, to be  followed by her primary care Mitchelle Sultan.   LABORATORIES:  Hemoglobin 9.4, hematocrit 26.9, white blood cell count 6.3,  platelet count 263.  Two Hemoccults done, negative.  Sodium 141, potassium  3.7, chloride 106, CO2 28, glucose 88, BUN 6, creatinine 0.8, AST 16, ALT  16, alkaline phos 91, total bilirubin 0.8.  Urine culture performed on January 11, 2004, 1000 colonies of E. coli.   Latest PT report indicates the patient is able to ambulate approximately 23  feet plus 2 with rolling walker, mod and min assist.  Able to perform bed  mobility modify independently, able to transfer with supervision and modify  independently on even surfaces, able to perform most ADLs modify  independently.  From a wheelchair standpoint, she is mod independently on  the unit.  Patient had home evaluation at the time of discharge.  She went  on a day pass the weekend prior to discharge and first Saturday did not go  as well, but Sunday went better.  Patient is a bit tearful from returning  from pass, due to circumstances.  The patient also was consulted by Dr.  Leonides Cave prior to discharge to deal with coping.  At time of discharge all  vitals were  stable, blood pressure was 130/83, respiratory rate was 20,  temperature was 98.7, pulse 108.  Patient is discharged home with her  family.   DISCHARGE MEDICATIONS INCLUDE:  1. Lexapro 10 mg daily.  2. Wellbutrin 150 mg daily.  3. Baclofen 20 mg three times daily.  4. Zanaflex 4 mg three times daily.  5. Seroquel 25 mg at night.  6. OxyContin, she can follow taper.  7. Lidoderm patch, on 12 hours, off 12 hours.  8. Oxycodone 5-10 mg 1-2 tablets q.4-6 hours as needed for pain.  9. Zofran 4 mg twice daily 8 a.m. and 8 p.m.  10.      Pain management OxyContin, oxycodone and Lidoderm patch.   She is to use a wheelchair.  No drinking, no driving, no alcohol, no  smoking.  She is to cath her bladder at least once a day, bowel regimen per  patient.  She is to have home health care,  PT, OT and a nurse.  She is to  followup with Dr. Thomasena Edis on March 03, 2004, at 10:45; Dr. Yetta Barre and Dr. Gerlene Fee in 2 weeks, call for an appointment;  Dr. Candice Camp, OB/GYN, call  for appointment, Dr. Foy Guadalajara to monitor her hemoglobin in 4-6 weeks.      Drucilla Schmidt, P.A.                         Ellwood Dense, M.D.    LB/MEDQ  D:  01/25/2004  T:  01/26/2004  Job:  [id number]   cc:   Ellwood Dense, M.D.  [address] [place]  [place] [address]  Fax: [phone]   Tia Alert, MD  [address]  [place], [place] [address]  Fax: [phone]   Reinaldo Meeker, M.D.  [address]  [place]  [place] [address]  Fax: [phone]   Dineen Kid. Rana Snare, M.D.  [address]  [place]  [place] [address]  Fax: [phone]

## 2011-03-16 LAB — POCT HEMOGLOBIN-HEMACUE: Hemoglobin: 11.8 — ABNORMAL LOW

## 2011-05-01 ENCOUNTER — Other Ambulatory Visit: Payer: Self-pay | Admitting: Urology

## 2011-05-16 ENCOUNTER — Encounter (HOSPITAL_COMMUNITY): Payer: Self-pay | Admitting: Pharmacy Technician

## 2011-05-16 ENCOUNTER — Encounter (HOSPITAL_COMMUNITY)
Admission: RE | Admit: 2011-05-16 | Discharge: 2011-05-16 | Disposition: A | Discharge status: Home / Self Care | Payer: BC Managed Care – PPO | Source: Ambulatory Visit | Attending: Urology | Admitting: Urology

## 2011-05-16 ENCOUNTER — Encounter (HOSPITAL_COMMUNITY): Payer: Self-pay

## 2011-05-16 HISTORY — DX: Neurogenic bowel, not elsewhere classified: K59.2

## 2011-05-16 HISTORY — DX: Major depressive disorder, single episode, unspecified: F32.9

## 2011-05-16 HISTORY — DX: Headache: R51

## 2011-05-16 HISTORY — DX: Edema, unspecified: R60.9

## 2011-05-16 HISTORY — DX: Dislocation of jaw, unspecified side, initial encounter: S03.00XA

## 2011-05-16 HISTORY — DX: Reserved for concepts with insufficient information to code with codable children: IMO0002

## 2011-05-16 HISTORY — DX: Anesthesia of skin: R20.0

## 2011-05-16 HISTORY — DX: Depression, unspecified: F32.A

## 2011-05-16 HISTORY — DX: Paresthesia of skin: R20.2

## 2011-05-16 HISTORY — DX: Neuromuscular dysfunction of bladder, unspecified: N31.9

## 2011-05-16 LAB — BASIC METABOLIC PANEL
BUN: 17 mg/dL (ref 6–23)
CO2: 30 mEq/L (ref 19–32)
Calcium: 9.2 mg/dL (ref 8.4–10.5)
Creatinine, Ser: 0.57 mg/dL (ref 0.50–1.10)
GFR calc non Af Amer: >90 mL/min (ref >90)
Glucose, Bld: 82 mg/dL (ref 70–99)
Sodium: 143 mEq/L (ref 135–145)

## 2011-05-16 LAB — CBC
HCT: 36.9 % (ref 36.0–46.0)
Hemoglobin: 11.9 g/dL — ABNORMAL LOW (ref 12.0–15.0)
MCH: 30.3 pg (ref 26.0–34.0)
MCHC: 32.2 g/dL (ref 30.0–36.0)
MCV: 93.9 fL (ref 78.0–100.0)
RDW: 12.6 % (ref 11.5–15.5)

## 2011-05-16 LAB — SURGICAL PCR SCREEN: Staphylococcus aureus: NEGATIVE

## 2011-05-16 NOTE — Patient Instructions (Addendum)
19 Melissa Mills  05/16/2011 Your procedure is scheduled on:  05-18-11 Report to [facility] at 0530 AM.  Call this number if you have problems the morning of surgery: [phone]   Remember:   Do not eat food:After Midnight.  May have clear liquids:until Midnight .  Clear liquids include soda, tea, black coffee, apple or grape juice, broth.  Take these medicines the morning of surgery with A SIP OF WATER: DEXILANT, BACLOFEN, MS CONTIN, OXYBUTYNIN   Do not wear jewelry, make-up or nail polish.  Do not wear lotions, powders, or perfumes.  Do not shave 48 hours prior to surgery.  Do not bring valuables to the hospital.  Contacts, dentures or bridgework may not be worn into surgery.  Leave suitcase in the car. After surgery it may be brought to your room.  For patients admitted to the hospital, checkout time is 11:00 AM the day of discharge.   Patients discharged the day of surgery will not be allowed to drive home.  Name and phone number of your driver:  Special Instructions: SOAP SHOWER, NIGHT BEFORE SURGERY AND MORNING OF SURGERY   Please read over the following fact sheets that you were given: MRSA Information

## 2011-05-18 ENCOUNTER — Encounter (HOSPITAL_COMMUNITY): Admission: RE | Payer: Self-pay | Source: Ambulatory Visit

## 2011-05-18 ENCOUNTER — Ambulatory Visit (HOSPITAL_COMMUNITY): Admission: RE | Admit: 2011-05-18 | Payer: BC Managed Care – PPO | Source: Ambulatory Visit | Admitting: Urology

## 2011-05-18 SURGERY — CYSTOSCOPY
Anesthesia: General

## 2011-05-21 ENCOUNTER — Other Ambulatory Visit: Payer: Self-pay | Admitting: Urology

## 2011-05-24 ENCOUNTER — Encounter (HOSPITAL_BASED_OUTPATIENT_CLINIC_OR_DEPARTMENT_OTHER): Payer: Self-pay | Admitting: *Deleted

## 2011-05-24 NOTE — H&P (Signed)
History of Present Illness   Melissa Mills has incomplete bladder emptying and neurogenic bladder. She is prone to infections, but I think has been much more compliant taking Macrodantin once a day. In the last few weeks her urine has had an odd smell. She is having diffuse bilateral back pain. Over the last several weeks she has been cathing a little bit more frequently and had one incontinent episode last week. She had BOTOX in May 6 and was taking oxybutynin ER 15 mg in the morning. Recently she was taking it in the morning and at night. She has been on a patch before to try to reduce oral intake and sometimes uses 2 patches. There is no other modifying factor or associated signs or symptoms. There is no other aggravating or relieving factors.   Her last renal ultrasound was in September 2009 and she had no hydronephrosis and a possible 3 mm and 4 mm stone in the left kidney. Review of systems: No other change in bowel or neurologic status.   Urinalysis: Today she underwent a urinalysis and it was negative. It was sent for culture.    Because of Darin's back pain and distant history of a renal ultrasound, I repeated her ultrasound today. Right kidney was 10.16 cm x 4.54 cm x 4.47 cm. Left kidney was 10.57 cm x 4.2 cm x 5.12 cm. Once again she may have had a 3 mm stone in the lower pole of the left kidney and had a 0.27 cm stone in the collecting system of the right kidney with no hydronephrosis. The kidneys were not swollen. Review of systems: No change in bowel or neurologic status.      Past Medical History Problems  1. History of  Anxiety (Symptom) 799.2 2. History of  Convulsions 780.39 3. History of  Depression 311 4. History of  Recent Methicillin-resistant Staphylococcus Aureus Infection 5. History of  Spinal Cord Injury 952.9  Surgical History Problems  1. History of  Cystoscopy With Dilation Of Bladder 2. History of  Urologic Surgery 3. History of  Urologic Surgery  Current Meds 1.  Adderall XR 15 MG Oral Capsule Extended Release 24 Hour; Status: ACTIVE 2. ALPRAZolam 0.5 MG Oral Tablet; Status: ACTIVE 3. Baclofen 20 MG Oral Tablet; Status: ACTIVE 4. CeleBREX 200 MG Oral Capsule; Status: ACTIVE 5. Cymbalta 60 MG Oral Capsule Delayed Release Particles; Status: ACTIVE 6. Depakote 125 MG Oral Tablet Delayed Release; Status: ACTIVE 7. Dexilant 60 MG Oral Capsule Delayed Release; Status: ACTIVE 8. Diazepam 5 MG Oral Tablet; Status: ACTIVE 9. Divalproex Sodium 500 MG Oral Tablet Extended Release 24 Hour; Status: ACTIVE 10. Ibuprofen 800 MG Oral Tablet; Status: ACTIVE 11. Maxalt 10 MG Oral Tablet; Status: ACTIVE 12. Morphine Sulfate CR 15 MG Oral Tablet Extended Release 12 Hour; Status: ACTIVE 13. MSIR 15 MG TABS; Status: ACTIVE 14. Ondansetron HCl 8 MG Oral Tablet; Status: ACTIVE 15. Oxybutynin Chloride 15 MG Oral Tablet Extended Release 24 Hour; TAKE ONE TABLET BY   MOUTH ONE TIME DAILY; Status: ACTIVE 16. Oxytrol 3.9 MG/24HR Transdermal Patch Biweekly; USE ONE PATCH EXTERNALLY TWICE A   WEEK AS DIRECTED; Status: ACTIVE 17. Polyethylene Glycol 3350 Oral Powder; Status: ACTIVE 18. Promethazine HCl 25 MG Rectal Suppository; INSERT 1 SUPPOSITORY RECTALLY EVERY 4 TO   6 HOURS AS NEEDED FOR NAUSEA AND VOMITING; Status: ACTIVE 19. TiZANidine HCl 4 MG Oral Tablet; Status: ACTIVE 20. Trimethoprim 100 MG Oral Tablet; TAKE ONE TABLET BY MOUTH ONE TIME DAILY; Status:   ACTIVE 21.  Zaleplon 10 MG Oral Capsule; Status: ACTIVE 22. Zanaflex 4 MG Oral Capsule; Status: ACTIVE  Allergies Medication  1. Codeine Derivatives 2. Macrobid CAPS 3. Sulfa Drugs  Family History Problems  1. Paternal history of  Acute Myocardial Infarction Category: Paternal history of; Status: Active 2. Paternal grandmother's history of  Acute Myocardial Infarction Category: Paternal grandmother's history of; Status: Active 3. Maternal grandfather's history of  Brain Tumor Category: Maternal grandfather's  history of; Status: Active 4. Paternal uncle's history of  Brain Tumor Category: Paternal uncle's history of; Status: Active 5. Maternal history of  Breast Cancer V16.3 Category: Maternal history of; Status: Active 6. Family history of  Family Health Status Number Of Children 1 SON, 1 DAUGHTER; Category: Family history of; Status: Active 7. Paternal history of  Urologic Disorder V18.7 Category: Paternal history of; Status: Active  Social History Problems  1. Caffeine Use 1 OR 2 PER DAY; Status: Active 2. Marital History - Currently Married Status: Active 3. Occupation: DISABLED; Status: Active Denied  4. Alcohol Use Status: Denied 5. Tobacco Use V15.82 Status: Denied  Vitals Vital Signs [Data Includes: Last 1 Day]  08Nov2011 11:46AM  Blood Pressure  Blood Pressure: 139 / 97 Temperature  Temperature: 98.2 F Pulse  Heart Rate: 118  Results/Data Urine [Data Includes: Last 1 Day]  08Nov2011  COLOR YELLOW  Reference Range YELLOW APPEARANCE CLEAR  Reference Range CLEAR SPECIFIC GRAVITY 1.015  Reference Range 1.005-1.030 pH 6.5  Reference Range 5.0-8.0 GLUCOSE NEG  mg/dL Reference Range NEG BILIRUBIN NEG  Reference Range NEG KETONE NEG  mg/dL Reference Range NEG BLOOD NEG  Reference Range NEG PROTEIN NEG  mg/dL Reference Range NEG UROBILINOGEN 0.2  mg/dL Reference Range 1.6-1.0 NITRITE NEG  Reference Range NEG LEUKOCYTE ESTERASE NEG  Reference Range NEG  Assessment Assessed  1. Incomplete Emptying Of Bladder 788.21 2. Neurogenic Bladder 596.54 3. Backache 724.5  Plan   [name] has had Clostridium difficile in the past but has had no bowel issues since.    Therma said her urine actually looked bad as well as smell bad. I have a hunch that the urinalysis today likely looked normal because I think she actually self treated herself. I asked her to take her Macrodantin twice a day for a week and then go back to once a day. Her compliance with daily prophylaxis versus  self treatment has been an ongoing pattern. I will see her in 6 months and p.r.n. We are going to call her in 2 days regardless of the culture results.  After a thorough review of the management options for the patient's condition the patient  elected to proceed with surgical therapy as noted above. We have discussed the potential benefits and risks of the procedure, side effects of the proposed treatment, the likelihood of the patient achieving the goals of the procedure, and any potential problems that might occur during the procedure or recuperation. Informed consent has been obtained.

## 2011-05-24 NOTE — Progress Notes (Signed)
NPO AFTER MN. PT ARRIVES AT 0615. CURRENT LAB RESULTS FROM 05-16-11 IN EPIC AND W/ CHART. WILL TAKE BACLOFEN, MS CONTIN, AND DEXILANT AM OF SURG. W/ SIP OF WATER. 

## 2011-05-25 ENCOUNTER — Encounter (HOSPITAL_BASED_OUTPATIENT_CLINIC_OR_DEPARTMENT_OTHER): Payer: Self-pay | Admitting: *Deleted

## 2011-05-25 ENCOUNTER — Encounter (HOSPITAL_BASED_OUTPATIENT_CLINIC_OR_DEPARTMENT_OTHER): Payer: Self-pay | Admitting: Anesthesiology

## 2011-05-25 ENCOUNTER — Ambulatory Visit (HOSPITAL_BASED_OUTPATIENT_CLINIC_OR_DEPARTMENT_OTHER)
Admission: RE | Admit: 2011-05-25 | Discharge: 2011-05-25 | Disposition: A | Discharge status: Home / Self Care | Payer: BC Managed Care – PPO | Source: Ambulatory Visit | Attending: Urology | Admitting: Urology

## 2011-05-25 ENCOUNTER — Ambulatory Visit (HOSPITAL_BASED_OUTPATIENT_CLINIC_OR_DEPARTMENT_OTHER): Payer: BC Managed Care – PPO | Admitting: Anesthesiology

## 2011-05-25 ENCOUNTER — Encounter (HOSPITAL_BASED_OUTPATIENT_CLINIC_OR_DEPARTMENT_OTHER)
Admission: RE | Disposition: A | Discharge status: Home / Self Care | Payer: Self-pay | Source: Ambulatory Visit | Attending: Urology

## 2011-05-25 DIAGNOSIS — N319 Neuromuscular dysfunction of bladder, unspecified: Secondary | ICD-10-CM | POA: Insufficient documentation

## 2011-05-25 DIAGNOSIS — N3941 Urge incontinence: Secondary | ICD-10-CM | POA: Insufficient documentation

## 2011-05-25 DIAGNOSIS — Z79899 Other long term (current) drug therapy: Secondary | ICD-10-CM | POA: Insufficient documentation

## 2011-05-25 HISTORY — DX: Dorsalgia, unspecified: M54.9

## 2011-05-25 HISTORY — DX: Other chronic pain: G89.29

## 2011-05-25 HISTORY — PX: CYSTOSCOPY: SHX5120

## 2011-05-25 HISTORY — DX: Personal history of other specified conditions: Z87.898

## 2011-05-25 HISTORY — DX: Personal history of Methicillin resistant Staphylococcus aureus infection: Z86.14

## 2011-05-25 HISTORY — DX: Urge incontinence: N39.41

## 2011-05-25 HISTORY — DX: Cervicalgia: M54.2

## 2011-05-25 HISTORY — DX: Insomnia, unspecified: G47.00

## 2011-05-25 HISTORY — DX: Retention of urine, unspecified: R33.9

## 2011-05-25 HISTORY — DX: Other specified disorders of bladder: N32.89

## 2011-05-25 HISTORY — DX: Paraplegia, unspecified: G82.20

## 2011-05-25 SURGERY — CYSTOSCOPY
Anesthesia: General | Site: Bladder | Wound class: Clean Contaminated

## 2011-05-25 MED ORDER — PROMETHAZINE HCL 25 MG/ML IJ SOLN: 6.2500 mg | INTRAMUSCULAR | Status: DC | PRN

## 2011-05-25 MED ORDER — GENTAMICIN IN SALINE 1.6-0.9 MG/ML-% IV SOLN
INTRAVENOUS | Status: DC | PRN
  Administered 2011-05-25: 140 mg via INTRAVENOUS

## 2011-05-25 MED ORDER — PROPOFOL 10 MG/ML IV EMUL
INTRAVENOUS | Status: DC | PRN
  Administered 2011-05-25: 180 mg via INTRAVENOUS

## 2011-05-25 MED ORDER — ONDANSETRON HCL 4 MG/2ML IJ SOLN
INTRAMUSCULAR | Status: DC | PRN
  Administered 2011-05-25: 4 mg via INTRAVENOUS

## 2011-05-25 MED ORDER — CIPROFLOXACIN HCL 250 MG PO TABS: 250.0000 mg | ORAL_TABLET | Freq: Two times a day (BID) | ORAL | Status: AC

## 2011-05-25 MED ORDER — LIDOCAINE HCL (CARDIAC) 20 MG/ML IV SOLN
INTRAVENOUS | Status: DC | PRN
  Administered 2011-05-25: 75 mg via INTRAVENOUS

## 2011-05-25 MED ORDER — ONABOTULINUMTOXINA 100 UNITS IJ SOLR
INTRAMUSCULAR | Status: DC | PRN
  Administered 2011-05-25: 400 [IU] via INTRAMUSCULAR

## 2011-05-25 MED ORDER — GENTAMICIN SULFATE 40 MG/ML IJ SOLN: 120.0000 mg | INTRAVENOUS | Status: DC

## 2011-05-25 MED ORDER — FENTANYL CITRATE 0.05 MG/ML IJ SOLN: 25.0000 ug | INTRAMUSCULAR | Status: DC | PRN

## 2011-05-25 MED ORDER — HYDROCODONE-ACETAMINOPHEN 5-500 MG PO TABS: 1.0000 | ORAL_TABLET | Freq: Four times a day (QID) | ORAL | Status: AC | PRN

## 2011-05-25 MED ORDER — LACTATED RINGERS IV SOLN
INTRAVENOUS | Status: DC | PRN
  Administered 2011-05-25: 08:00:00 via INTRAVENOUS

## 2011-05-25 MED ORDER — STERILE WATER FOR IRRIGATION IR SOLN
Status: DC | PRN
  Administered 2011-05-25: 3000 mL

## 2011-05-25 MED ORDER — FENTANYL CITRATE 0.05 MG/ML IJ SOLN
INTRAMUSCULAR | Status: DC | PRN
  Administered 2011-05-25 (×2): 50 ug via INTRAVENOUS

## 2011-05-25 MED ORDER — LACTATED RINGERS IV SOLN
INTRAVENOUS | Status: DC
  Administered 2011-05-25: 08:00:00 via INTRAVENOUS

## 2011-05-25 SURGICAL SUPPLY — 31 items
ADAPTER CATH URET PLST 4-6FR (CATHETERS) IMPLANT
ADPR CATH URET STRL DISP 4-6FR (CATHETERS)
BAG DRAIN URO-CYSTO SKYTR STRL (DRAIN) ×2 IMPLANT
BAG DRN UROCATH (DRAIN) ×1
BALLN NEPHROSTOMY (BALLOONS)
BALLOON NEPHROSTOMY (BALLOONS) IMPLANT
CANISTER SUCT LVC 12 LTR MEDI- (MISCELLANEOUS) ×2 IMPLANT
CATH ROBINSON RED A/P 14FR (CATHETERS) ×2 IMPLANT
CATH URET 5FR 28IN CONE TIP (BALLOONS)
CATH URET 5FR 70CM CONE TIP (BALLOONS) IMPLANT
CLOTH BEACON ORANGE TIMEOUT ST (SAFETY) ×2 IMPLANT
DRAPE CAMERA CLOSED 9X96 (DRAPES) ×2 IMPLANT
GLOVE BIO SURGEON STRL SZ7.5 (GLOVE) ×2 IMPLANT
GLOVE BIOGEL PI IND STRL 6.5 (GLOVE) ×1 IMPLANT
GLOVE BIOGEL PI IND STRL 7.5 (GLOVE) ×1 IMPLANT
GLOVE BIOGEL PI INDICATOR 6.5 (GLOVE) ×1
GLOVE BIOGEL PI INDICATOR 7.5 (GLOVE) ×1
GLOVE ECLIPSE 6.0 STRL STRAW (GLOVE) ×2 IMPLANT
GLOVE SKINSENSE NS SZ7.0 (GLOVE) ×1
GLOVE SKINSENSE STRL SZ7.0 (GLOVE) ×1 IMPLANT
GOWN PREVENTION PLUS LG XLONG (DISPOSABLE) ×2 IMPLANT
GOWN STRL REIN XL XLG (GOWN DISPOSABLE) ×2 IMPLANT
GUIDEWIRE STR DUAL SENSOR (WIRE) IMPLANT
NDL SAFETY ECLIPSE 18X1.5 (NEEDLE) IMPLANT
NEEDLE HYPO 18GX1.5 SHARP (NEEDLE)
NS IRRIG 500ML POUR BTL (IV SOLUTION) IMPLANT
PACK CYSTOSCOPY (CUSTOM PROCEDURE TRAY) ×2 IMPLANT
SYR 20CC LL (SYRINGE) IMPLANT
SYRINGE IRR TOOMEY STRL 70CC (SYRINGE) IMPLANT
WATER STERILE IRR 3000ML UROMA (IV SOLUTION) ×2 IMPLANT
WATER STERILE IRR 500ML POUR (IV SOLUTION) ×2 IMPLANT

## 2011-05-25 NOTE — Anesthesia Procedure Notes (Signed)
Procedure Name: LMA Insertion Date/Time: 05/25/2011 8:56 AM Performed by: Iline Oven Pre-anesthesia Checklist: Patient identified, Emergency Drugs available, Suction available and Patient being monitored Patient Re-evaluated:Patient Re-evaluated prior to inductionOxygen Delivery Method: Circle System Utilized Preoxygenation: Pre-oxygenation with 100% oxygen Intubation Type: IV induction Ventilation: Mask ventilation without difficulty LMA: LMA inserted LMA Size: 4.0 Number of attempts: 1 Airway Equipment and Method: bite block Placement Confirmation: positive ETCO2 Tube secured with: Tape Dental Injury: Teeth and Oropharynx as per pre-operative assessment

## 2011-05-25 NOTE — Transfer of Care (Signed)
Immediate Anesthesia Transfer of Care Note  Patient: Melissa Mills  Procedure(s) Performed:  CYSTOSCOPY - cysto with botox injection  Patient Location: PACU  Anesthesia Type: General  Level of Consciousness: awake, sedated, patient cooperative and responds to stimulation  Airway & Oxygen Therapy: Patient Spontanous Breathing and Patient connected to face mask oxygen  Post-op Assessment: Report given to PACU RN, Post -op Vital signs reviewed and stable and Patient moving all extremities  Post vital signs: Reviewed and stable  Complications: No apparent anesthesia complications

## 2011-05-25 NOTE — Op Note (Signed)
Preoperative diagnosis: Neurogenic detrusor overactivity plus urge incontinence Postoperative diagnosis: Neurogenic detrusor overactivity plus urge incontinence Surgery: Cystoscopy plus injection of Botox Surgeon Dr. Lorin Picket Genessa Beman   Ms. Picazo has a neurogenic bladder with urge incontinence and leakage without awareness. She consented to the above procedure. Her urine with sterile preoperatively Patient was prepped and draped in the usual fashion  ACMI scope was utilized. 400 units of Botox was instilled in through cc and normal saline. I injected with my usual template at 5 and 1:30 and cephalad to the interior ureteric ridge. The injections went very well. There was no bleeding. The bladder mucosa and trigone were otherwise normal. There is no stitch form other carcinoma  Bladder was emptied and the patient taken to recovery room

## 2011-05-25 NOTE — H&P (View-Only) (Signed)
NPO AFTER MN. PT ARRIVES AT 0615. CURRENT LAB RESULTS FROM 05-16-11 IN EPIC AND W/ CHART. WILL TAKE BACLOFEN, MS CONTIN, AND DEXILANT AM OF SURG. W/ SIP OF WATER.

## 2011-05-25 NOTE — Anesthesia Preprocedure Evaluation (Addendum)
Anesthesia Evaluation  Patient identified by MRN, date of birth, ID band Patient awake    Reviewed: Allergy & Precautions, H&P , NPO status , Patient's Chart, lab work & pertinent test results  Airway Mallampati: II TM Distance: >3 FB Neck ROM: Full    Dental No notable dental hx.    Pulmonary neg pulmonary ROS,  clear to auscultation  Pulmonary exam normal       Cardiovascular neg cardio ROS Regular Normal    Neuro/Psych  Headaches, PSYCHIATRIC DISORDERS Paraplegia. incontinence Negative Neurological ROS  Negative Psych ROS   GI/Hepatic negative GI ROS, Neg liver ROS,   Endo/Other  Negative Endocrine ROS  Renal/GU negative Renal ROS  Genitourinary negative   Musculoskeletal negative musculoskeletal ROS (+)   Abdominal   Peds negative pediatric ROS (+)  Hematology negative hematology ROS (+)   Anesthesia Other Findings paraplegia  Reproductive/Obstetrics negative OB ROS                          Anesthesia Physical Anesthesia Plan  ASA: II  Anesthesia Plan: General   Post-op Pain Management:    Induction: Intravenous  Airway Management Planned: LMA  Additional Equipment:   Intra-op Plan:   Post-operative Plan: Extubation in OR  Informed Consent: I have reviewed the patients History and Physical, chart, labs and discussed the procedure including the risks, benefits and alternatives for the proposed anesthesia with the patient or authorized representative who has indicated his/her understanding and acceptance.   Dental advisory given  Plan Discussed with: CRNA  Anesthesia Plan Comments:         Anesthesia Quick Evaluation

## 2011-05-25 NOTE — Interval H&P Note (Signed)
History and Physical Interval Note:  05/25/2011 7:18 AM  Melissa Mills  has presented today for surgery, with the diagnosis of urge incontinence  The various methods of treatment have been discussed with the patient and family. After consideration of risks, benefits and other options for treatment, the patient has consented to  Procedure(s): CYSTOSCOPY as a surgical intervention .  The patients' history has been reviewed, patient examined, no change in status, stable for surgery.  I have reviewed the patients' chart and labs.  Questions were answered to the patient's satisfaction.     Jorryn Hershberger A

## 2011-05-25 NOTE — Anesthesia Postprocedure Evaluation (Signed)
  Anesthesia Post-op Note  Patient: Melissa Mills  Procedure(s) Performed:  CYSTOSCOPY - cysto with botox injection  Patient Location: PACU  Anesthesia Type: General  Level of Consciousness: awake and alert   Airway and Oxygen Therapy: Patient Spontanous Breathing  Post-op Pain: mild  Post-op Assessment: Post-op Vital signs reviewed, Patient's Cardiovascular Status Stable, Respiratory Function Stable, Patent Airway and No signs of Nausea or vomiting  Post-op Vital Signs: stable  Complications: No apparent anesthesia complications

## 2011-05-28 ENCOUNTER — Encounter (HOSPITAL_BASED_OUTPATIENT_CLINIC_OR_DEPARTMENT_OTHER): Payer: Self-pay | Admitting: Urology

## 2011-06-06 ENCOUNTER — Encounter (HOSPITAL_BASED_OUTPATIENT_CLINIC_OR_DEPARTMENT_OTHER): Payer: Self-pay

## 2011-06-21 ENCOUNTER — Other Ambulatory Visit: Payer: Self-pay | Admitting: Family Medicine

## 2011-06-21 DIAGNOSIS — Z1231 Encounter for screening mammogram for malignant neoplasm of breast: Secondary | ICD-10-CM

## 2011-07-02 DIAGNOSIS — B49 Unspecified mycosis: Secondary | ICD-10-CM | POA: Diagnosis not present

## 2011-07-02 DIAGNOSIS — M545 Low back pain, unspecified: Secondary | ICD-10-CM | POA: Diagnosis not present

## 2011-07-02 DIAGNOSIS — M546 Pain in thoracic spine: Secondary | ICD-10-CM | POA: Diagnosis not present

## 2011-07-02 DIAGNOSIS — F339 Major depressive disorder, recurrent, unspecified: Secondary | ICD-10-CM | POA: Diagnosis not present

## 2011-07-06 ENCOUNTER — Ambulatory Visit: Payer: BC Managed Care – PPO

## 2011-07-10 ENCOUNTER — Ambulatory Visit
Admission: RE | Admit: 2011-07-10 | Discharge: 2011-07-10 | Disposition: A | Discharge status: Home / Self Care | Payer: BC Managed Care – PPO | Source: Ambulatory Visit | Attending: Family Medicine | Admitting: Family Medicine

## 2011-07-10 DIAGNOSIS — E236 Other disorders of pituitary gland: Secondary | ICD-10-CM | POA: Diagnosis not present

## 2011-07-10 DIAGNOSIS — Z1231 Encounter for screening mammogram for malignant neoplasm of breast: Secondary | ICD-10-CM

## 2011-07-12 DIAGNOSIS — M6240 Contracture of muscle, unspecified site: Secondary | ICD-10-CM | POA: Diagnosis not present

## 2011-07-18 DIAGNOSIS — M6240 Contracture of muscle, unspecified site: Secondary | ICD-10-CM | POA: Diagnosis not present

## 2011-07-18 DIAGNOSIS — M25579 Pain in unspecified ankle and joints of unspecified foot: Secondary | ICD-10-CM | POA: Diagnosis not present

## 2011-07-19 DIAGNOSIS — M6281 Muscle weakness (generalized): Secondary | ICD-10-CM | POA: Diagnosis not present

## 2011-07-19 DIAGNOSIS — R269 Unspecified abnormalities of gait and mobility: Secondary | ICD-10-CM | POA: Diagnosis not present

## 2011-07-19 DIAGNOSIS — M2560 Stiffness of unspecified joint, not elsewhere classified: Secondary | ICD-10-CM | POA: Diagnosis not present

## 2011-07-24 DIAGNOSIS — M6240 Contracture of muscle, unspecified site: Secondary | ICD-10-CM | POA: Diagnosis not present

## 2011-07-24 DIAGNOSIS — M7150 Other bursitis, not elsewhere classified, unspecified site: Secondary | ICD-10-CM | POA: Diagnosis not present

## 2011-07-25 DIAGNOSIS — R269 Unspecified abnormalities of gait and mobility: Secondary | ICD-10-CM | POA: Diagnosis not present

## 2011-07-25 DIAGNOSIS — M2560 Stiffness of unspecified joint, not elsewhere classified: Secondary | ICD-10-CM | POA: Diagnosis not present

## 2011-07-25 DIAGNOSIS — M6281 Muscle weakness (generalized): Secondary | ICD-10-CM | POA: Diagnosis not present

## 2011-07-31 DIAGNOSIS — R269 Unspecified abnormalities of gait and mobility: Secondary | ICD-10-CM | POA: Diagnosis not present

## 2011-07-31 DIAGNOSIS — M2560 Stiffness of unspecified joint, not elsewhere classified: Secondary | ICD-10-CM | POA: Diagnosis not present

## 2011-07-31 DIAGNOSIS — M6281 Muscle weakness (generalized): Secondary | ICD-10-CM | POA: Diagnosis not present

## 2011-08-02 DIAGNOSIS — R269 Unspecified abnormalities of gait and mobility: Secondary | ICD-10-CM | POA: Diagnosis not present

## 2011-08-02 DIAGNOSIS — M6281 Muscle weakness (generalized): Secondary | ICD-10-CM | POA: Diagnosis not present

## 2011-08-02 DIAGNOSIS — M2560 Stiffness of unspecified joint, not elsewhere classified: Secondary | ICD-10-CM | POA: Diagnosis not present

## 2011-08-07 DIAGNOSIS — M6281 Muscle weakness (generalized): Secondary | ICD-10-CM | POA: Diagnosis not present

## 2011-08-07 DIAGNOSIS — R269 Unspecified abnormalities of gait and mobility: Secondary | ICD-10-CM | POA: Diagnosis not present

## 2011-08-07 DIAGNOSIS — M2560 Stiffness of unspecified joint, not elsewhere classified: Secondary | ICD-10-CM | POA: Diagnosis not present

## 2011-08-08 DIAGNOSIS — M545 Low back pain, unspecified: Secondary | ICD-10-CM | POA: Diagnosis not present

## 2011-08-08 DIAGNOSIS — M546 Pain in thoracic spine: Secondary | ICD-10-CM | POA: Diagnosis not present

## 2011-08-08 DIAGNOSIS — F988 Other specified behavioral and emotional disorders with onset usually occurring in childhood and adolescence: Secondary | ICD-10-CM | POA: Diagnosis not present

## 2011-08-09 DIAGNOSIS — R269 Unspecified abnormalities of gait and mobility: Secondary | ICD-10-CM | POA: Diagnosis not present

## 2011-08-09 DIAGNOSIS — M6281 Muscle weakness (generalized): Secondary | ICD-10-CM | POA: Diagnosis not present

## 2011-08-09 DIAGNOSIS — M2560 Stiffness of unspecified joint, not elsewhere classified: Secondary | ICD-10-CM | POA: Diagnosis not present

## 2011-08-16 DIAGNOSIS — M2560 Stiffness of unspecified joint, not elsewhere classified: Secondary | ICD-10-CM | POA: Diagnosis not present

## 2011-08-16 DIAGNOSIS — M6281 Muscle weakness (generalized): Secondary | ICD-10-CM | POA: Diagnosis not present

## 2011-08-16 DIAGNOSIS — R269 Unspecified abnormalities of gait and mobility: Secondary | ICD-10-CM | POA: Diagnosis not present

## 2011-08-21 DIAGNOSIS — R269 Unspecified abnormalities of gait and mobility: Secondary | ICD-10-CM | POA: Diagnosis not present

## 2011-08-21 DIAGNOSIS — M6281 Muscle weakness (generalized): Secondary | ICD-10-CM | POA: Diagnosis not present

## 2011-08-21 DIAGNOSIS — M2560 Stiffness of unspecified joint, not elsewhere classified: Secondary | ICD-10-CM | POA: Diagnosis not present

## 2011-08-23 DIAGNOSIS — M5137 Other intervertebral disc degeneration, lumbosacral region: Secondary | ICD-10-CM | POA: Diagnosis not present

## 2011-08-30 DIAGNOSIS — M2560 Stiffness of unspecified joint, not elsewhere classified: Secondary | ICD-10-CM | POA: Diagnosis not present

## 2011-08-30 DIAGNOSIS — R269 Unspecified abnormalities of gait and mobility: Secondary | ICD-10-CM | POA: Diagnosis not present

## 2011-08-30 DIAGNOSIS — M6281 Muscle weakness (generalized): Secondary | ICD-10-CM | POA: Diagnosis not present

## 2011-09-04 DIAGNOSIS — M6281 Muscle weakness (generalized): Secondary | ICD-10-CM | POA: Diagnosis not present

## 2011-09-04 DIAGNOSIS — M2560 Stiffness of unspecified joint, not elsewhere classified: Secondary | ICD-10-CM | POA: Diagnosis not present

## 2011-09-04 DIAGNOSIS — R269 Unspecified abnormalities of gait and mobility: Secondary | ICD-10-CM | POA: Diagnosis not present

## 2011-09-05 DIAGNOSIS — M546 Pain in thoracic spine: Secondary | ICD-10-CM | POA: Diagnosis not present

## 2011-09-05 DIAGNOSIS — G8220 Paraplegia, unspecified: Secondary | ICD-10-CM | POA: Diagnosis not present

## 2011-09-05 DIAGNOSIS — F3289 Other specified depressive episodes: Secondary | ICD-10-CM | POA: Diagnosis not present

## 2011-09-05 DIAGNOSIS — F411 Generalized anxiety disorder: Secondary | ICD-10-CM | POA: Diagnosis not present

## 2011-09-05 DIAGNOSIS — F988 Other specified behavioral and emotional disorders with onset usually occurring in childhood and adolescence: Secondary | ICD-10-CM | POA: Diagnosis not present

## 2011-09-05 DIAGNOSIS — M545 Low back pain, unspecified: Secondary | ICD-10-CM | POA: Diagnosis not present

## 2011-09-05 DIAGNOSIS — M9981 Other biomechanical lesions of cervical region: Secondary | ICD-10-CM | POA: Diagnosis not present

## 2011-09-05 DIAGNOSIS — F329 Major depressive disorder, single episode, unspecified: Secondary | ICD-10-CM | POA: Diagnosis not present

## 2011-09-11 DIAGNOSIS — R269 Unspecified abnormalities of gait and mobility: Secondary | ICD-10-CM | POA: Diagnosis not present

## 2011-09-11 DIAGNOSIS — M6281 Muscle weakness (generalized): Secondary | ICD-10-CM | POA: Diagnosis not present

## 2011-09-11 DIAGNOSIS — M2560 Stiffness of unspecified joint, not elsewhere classified: Secondary | ICD-10-CM | POA: Diagnosis not present

## 2011-09-13 DIAGNOSIS — M2560 Stiffness of unspecified joint, not elsewhere classified: Secondary | ICD-10-CM | POA: Diagnosis not present

## 2011-09-13 DIAGNOSIS — R269 Unspecified abnormalities of gait and mobility: Secondary | ICD-10-CM | POA: Diagnosis not present

## 2011-09-13 DIAGNOSIS — M6281 Muscle weakness (generalized): Secondary | ICD-10-CM | POA: Diagnosis not present

## 2011-09-18 DIAGNOSIS — R269 Unspecified abnormalities of gait and mobility: Secondary | ICD-10-CM | POA: Diagnosis not present

## 2011-09-18 DIAGNOSIS — M6281 Muscle weakness (generalized): Secondary | ICD-10-CM | POA: Diagnosis not present

## 2011-09-18 DIAGNOSIS — M2560 Stiffness of unspecified joint, not elsewhere classified: Secondary | ICD-10-CM | POA: Diagnosis not present

## 2011-09-20 DIAGNOSIS — M6281 Muscle weakness (generalized): Secondary | ICD-10-CM | POA: Diagnosis not present

## 2011-09-20 DIAGNOSIS — R269 Unspecified abnormalities of gait and mobility: Secondary | ICD-10-CM | POA: Diagnosis not present

## 2011-09-20 DIAGNOSIS — M2560 Stiffness of unspecified joint, not elsewhere classified: Secondary | ICD-10-CM | POA: Diagnosis not present

## 2011-09-27 DIAGNOSIS — M6281 Muscle weakness (generalized): Secondary | ICD-10-CM | POA: Diagnosis not present

## 2011-09-27 DIAGNOSIS — M2560 Stiffness of unspecified joint, not elsewhere classified: Secondary | ICD-10-CM | POA: Diagnosis not present

## 2011-09-27 DIAGNOSIS — R269 Unspecified abnormalities of gait and mobility: Secondary | ICD-10-CM | POA: Diagnosis not present

## 2011-10-02 DIAGNOSIS — M6281 Muscle weakness (generalized): Secondary | ICD-10-CM | POA: Diagnosis not present

## 2011-10-02 DIAGNOSIS — M2560 Stiffness of unspecified joint, not elsewhere classified: Secondary | ICD-10-CM | POA: Diagnosis not present

## 2011-10-02 DIAGNOSIS — R269 Unspecified abnormalities of gait and mobility: Secondary | ICD-10-CM | POA: Diagnosis not present

## 2011-10-03 DIAGNOSIS — M545 Low back pain, unspecified: Secondary | ICD-10-CM | POA: Diagnosis not present

## 2011-10-03 DIAGNOSIS — F329 Major depressive disorder, single episode, unspecified: Secondary | ICD-10-CM | POA: Diagnosis not present

## 2011-10-03 DIAGNOSIS — F411 Generalized anxiety disorder: Secondary | ICD-10-CM | POA: Diagnosis not present

## 2011-10-03 DIAGNOSIS — M546 Pain in thoracic spine: Secondary | ICD-10-CM | POA: Diagnosis not present

## 2011-10-03 DIAGNOSIS — F3289 Other specified depressive episodes: Secondary | ICD-10-CM | POA: Diagnosis not present

## 2011-10-03 DIAGNOSIS — G8220 Paraplegia, unspecified: Secondary | ICD-10-CM | POA: Diagnosis not present

## 2011-10-03 DIAGNOSIS — F988 Other specified behavioral and emotional disorders with onset usually occurring in childhood and adolescence: Secondary | ICD-10-CM | POA: Diagnosis not present

## 2011-10-03 DIAGNOSIS — M9981 Other biomechanical lesions of cervical region: Secondary | ICD-10-CM | POA: Diagnosis not present

## 2011-10-09 DIAGNOSIS — M6281 Muscle weakness (generalized): Secondary | ICD-10-CM | POA: Diagnosis not present

## 2011-10-09 DIAGNOSIS — R269 Unspecified abnormalities of gait and mobility: Secondary | ICD-10-CM | POA: Diagnosis not present

## 2011-10-09 DIAGNOSIS — M2560 Stiffness of unspecified joint, not elsewhere classified: Secondary | ICD-10-CM | POA: Diagnosis not present

## 2011-10-11 DIAGNOSIS — R269 Unspecified abnormalities of gait and mobility: Secondary | ICD-10-CM | POA: Diagnosis not present

## 2011-10-11 DIAGNOSIS — M6281 Muscle weakness (generalized): Secondary | ICD-10-CM | POA: Diagnosis not present

## 2011-10-11 DIAGNOSIS — M2560 Stiffness of unspecified joint, not elsewhere classified: Secondary | ICD-10-CM | POA: Diagnosis not present

## 2011-10-16 DIAGNOSIS — M6281 Muscle weakness (generalized): Secondary | ICD-10-CM | POA: Diagnosis not present

## 2011-10-16 DIAGNOSIS — M2560 Stiffness of unspecified joint, not elsewhere classified: Secondary | ICD-10-CM | POA: Diagnosis not present

## 2011-10-16 DIAGNOSIS — R269 Unspecified abnormalities of gait and mobility: Secondary | ICD-10-CM | POA: Diagnosis not present

## 2011-10-23 DIAGNOSIS — M2560 Stiffness of unspecified joint, not elsewhere classified: Secondary | ICD-10-CM | POA: Diagnosis not present

## 2011-10-23 DIAGNOSIS — R269 Unspecified abnormalities of gait and mobility: Secondary | ICD-10-CM | POA: Diagnosis not present

## 2011-10-23 DIAGNOSIS — M6281 Muscle weakness (generalized): Secondary | ICD-10-CM | POA: Diagnosis not present

## 2011-10-25 DIAGNOSIS — R269 Unspecified abnormalities of gait and mobility: Secondary | ICD-10-CM | POA: Diagnosis not present

## 2011-10-25 DIAGNOSIS — M2560 Stiffness of unspecified joint, not elsewhere classified: Secondary | ICD-10-CM | POA: Diagnosis not present

## 2011-10-25 DIAGNOSIS — M6281 Muscle weakness (generalized): Secondary | ICD-10-CM | POA: Diagnosis not present

## 2011-10-31 DIAGNOSIS — F329 Major depressive disorder, single episode, unspecified: Secondary | ICD-10-CM | POA: Diagnosis not present

## 2011-10-31 DIAGNOSIS — F3289 Other specified depressive episodes: Secondary | ICD-10-CM | POA: Diagnosis not present

## 2011-10-31 DIAGNOSIS — N390 Urinary tract infection, site not specified: Secondary | ICD-10-CM | POA: Diagnosis not present

## 2011-10-31 DIAGNOSIS — F529 Unspecified sexual dysfunction not due to a substance or known physiological condition: Secondary | ICD-10-CM | POA: Diagnosis not present

## 2011-10-31 DIAGNOSIS — F988 Other specified behavioral and emotional disorders with onset usually occurring in childhood and adolescence: Secondary | ICD-10-CM | POA: Diagnosis not present

## 2011-11-01 DIAGNOSIS — M545 Low back pain, unspecified: Secondary | ICD-10-CM | POA: Diagnosis not present

## 2011-11-01 DIAGNOSIS — M79609 Pain in unspecified limb: Secondary | ICD-10-CM | POA: Diagnosis not present

## 2011-11-01 DIAGNOSIS — M9981 Other biomechanical lesions of cervical region: Secondary | ICD-10-CM | POA: Diagnosis not present

## 2011-11-01 DIAGNOSIS — M546 Pain in thoracic spine: Secondary | ICD-10-CM | POA: Diagnosis not present

## 2011-11-13 DIAGNOSIS — M5137 Other intervertebral disc degeneration, lumbosacral region: Secondary | ICD-10-CM | POA: Diagnosis not present

## 2011-11-28 DIAGNOSIS — M546 Pain in thoracic spine: Secondary | ICD-10-CM | POA: Diagnosis not present

## 2011-11-28 DIAGNOSIS — N319 Neuromuscular dysfunction of bladder, unspecified: Secondary | ICD-10-CM | POA: Diagnosis not present

## 2011-11-28 DIAGNOSIS — F529 Unspecified sexual dysfunction not due to a substance or known physiological condition: Secondary | ICD-10-CM | POA: Diagnosis not present

## 2011-11-28 DIAGNOSIS — F988 Other specified behavioral and emotional disorders with onset usually occurring in childhood and adolescence: Secondary | ICD-10-CM | POA: Diagnosis not present

## 2011-11-28 DIAGNOSIS — M545 Low back pain, unspecified: Secondary | ICD-10-CM | POA: Diagnosis not present

## 2011-11-28 DIAGNOSIS — M79609 Pain in unspecified limb: Secondary | ICD-10-CM | POA: Diagnosis not present

## 2011-11-28 DIAGNOSIS — M9981 Other biomechanical lesions of cervical region: Secondary | ICD-10-CM | POA: Diagnosis not present

## 2011-12-24 DIAGNOSIS — J329 Chronic sinusitis, unspecified: Secondary | ICD-10-CM | POA: Diagnosis not present

## 2011-12-24 DIAGNOSIS — M545 Low back pain, unspecified: Secondary | ICD-10-CM | POA: Diagnosis not present

## 2011-12-24 DIAGNOSIS — M79609 Pain in unspecified limb: Secondary | ICD-10-CM | POA: Diagnosis not present

## 2011-12-24 DIAGNOSIS — F988 Other specified behavioral and emotional disorders with onset usually occurring in childhood and adolescence: Secondary | ICD-10-CM | POA: Diagnosis not present

## 2011-12-24 DIAGNOSIS — F411 Generalized anxiety disorder: Secondary | ICD-10-CM | POA: Diagnosis not present

## 2011-12-24 DIAGNOSIS — M546 Pain in thoracic spine: Secondary | ICD-10-CM | POA: Diagnosis not present

## 2011-12-24 DIAGNOSIS — M9981 Other biomechanical lesions of cervical region: Secondary | ICD-10-CM | POA: Diagnosis not present

## 2011-12-24 DIAGNOSIS — N319 Neuromuscular dysfunction of bladder, unspecified: Secondary | ICD-10-CM | POA: Diagnosis not present

## 2012-01-23 DIAGNOSIS — M545 Low back pain, unspecified: Secondary | ICD-10-CM | POA: Diagnosis not present

## 2012-01-23 DIAGNOSIS — M79609 Pain in unspecified limb: Secondary | ICD-10-CM | POA: Diagnosis not present

## 2012-01-23 DIAGNOSIS — N319 Neuromuscular dysfunction of bladder, unspecified: Secondary | ICD-10-CM | POA: Diagnosis not present

## 2012-01-23 DIAGNOSIS — M9981 Other biomechanical lesions of cervical region: Secondary | ICD-10-CM | POA: Diagnosis not present

## 2012-01-23 DIAGNOSIS — M546 Pain in thoracic spine: Secondary | ICD-10-CM | POA: Diagnosis not present

## 2012-01-23 DIAGNOSIS — F988 Other specified behavioral and emotional disorders with onset usually occurring in childhood and adolescence: Secondary | ICD-10-CM | POA: Diagnosis not present

## 2012-01-31 DIAGNOSIS — N319 Neuromuscular dysfunction of bladder, unspecified: Secondary | ICD-10-CM | POA: Diagnosis not present

## 2012-01-31 DIAGNOSIS — M7750 Other enthesopathy of unspecified foot: Secondary | ICD-10-CM | POA: Diagnosis not present

## 2012-02-04 DIAGNOSIS — N319 Neuromuscular dysfunction of bladder, unspecified: Secondary | ICD-10-CM | POA: Diagnosis not present

## 2012-02-04 DIAGNOSIS — N390 Urinary tract infection, site not specified: Secondary | ICD-10-CM | POA: Diagnosis not present

## 2012-02-04 DIAGNOSIS — R339 Retention of urine, unspecified: Secondary | ICD-10-CM | POA: Diagnosis not present

## 2012-02-14 DIAGNOSIS — M5137 Other intervertebral disc degeneration, lumbosacral region: Secondary | ICD-10-CM | POA: Diagnosis not present

## 2012-02-27 DIAGNOSIS — Z23 Encounter for immunization: Secondary | ICD-10-CM | POA: Diagnosis not present

## 2012-02-27 DIAGNOSIS — M546 Pain in thoracic spine: Secondary | ICD-10-CM | POA: Diagnosis not present

## 2012-02-27 DIAGNOSIS — F988 Other specified behavioral and emotional disorders with onset usually occurring in childhood and adolescence: Secondary | ICD-10-CM | POA: Diagnosis not present

## 2012-02-27 DIAGNOSIS — M545 Low back pain, unspecified: Secondary | ICD-10-CM | POA: Diagnosis not present

## 2012-02-27 DIAGNOSIS — M9981 Other biomechanical lesions of cervical region: Secondary | ICD-10-CM | POA: Diagnosis not present

## 2012-02-29 DIAGNOSIS — R109 Unspecified abdominal pain: Secondary | ICD-10-CM | POA: Diagnosis not present

## 2012-02-29 DIAGNOSIS — R339 Retention of urine, unspecified: Secondary | ICD-10-CM | POA: Diagnosis not present

## 2012-02-29 DIAGNOSIS — N3941 Urge incontinence: Secondary | ICD-10-CM | POA: Diagnosis not present

## 2012-03-24 DIAGNOSIS — M545 Low back pain, unspecified: Secondary | ICD-10-CM | POA: Diagnosis not present

## 2012-03-24 DIAGNOSIS — F988 Other specified behavioral and emotional disorders with onset usually occurring in childhood and adolescence: Secondary | ICD-10-CM | POA: Diagnosis not present

## 2012-03-24 DIAGNOSIS — M546 Pain in thoracic spine: Secondary | ICD-10-CM | POA: Diagnosis not present

## 2012-03-24 DIAGNOSIS — F3289 Other specified depressive episodes: Secondary | ICD-10-CM | POA: Diagnosis not present

## 2012-03-24 DIAGNOSIS — F329 Major depressive disorder, single episode, unspecified: Secondary | ICD-10-CM | POA: Diagnosis not present

## 2012-03-24 DIAGNOSIS — K2960 Other gastritis without bleeding: Secondary | ICD-10-CM | POA: Diagnosis not present

## 2012-04-17 DIAGNOSIS — G43019 Migraine without aura, intractable, without status migrainosus: Secondary | ICD-10-CM | POA: Diagnosis not present

## 2012-04-23 DIAGNOSIS — G8110 Spastic hemiplegia affecting unspecified side: Secondary | ICD-10-CM | POA: Diagnosis not present

## 2012-04-23 DIAGNOSIS — M546 Pain in thoracic spine: Secondary | ICD-10-CM | POA: Diagnosis not present

## 2012-04-23 DIAGNOSIS — M722 Plantar fascial fibromatosis: Secondary | ICD-10-CM | POA: Diagnosis not present

## 2012-04-23 DIAGNOSIS — G8220 Paraplegia, unspecified: Secondary | ICD-10-CM | POA: Diagnosis not present

## 2012-04-23 DIAGNOSIS — M545 Low back pain, unspecified: Secondary | ICD-10-CM | POA: Diagnosis not present

## 2012-04-23 DIAGNOSIS — Z8669 Personal history of other diseases of the nervous system and sense organs: Secondary | ICD-10-CM | POA: Diagnosis not present

## 2012-04-23 DIAGNOSIS — F339 Major depressive disorder, recurrent, unspecified: Secondary | ICD-10-CM | POA: Diagnosis not present

## 2012-05-21 DIAGNOSIS — M545 Low back pain, unspecified: Secondary | ICD-10-CM | POA: Diagnosis not present

## 2012-05-21 DIAGNOSIS — F988 Other specified behavioral and emotional disorders with onset usually occurring in childhood and adolescence: Secondary | ICD-10-CM | POA: Diagnosis not present

## 2012-05-21 DIAGNOSIS — R109 Unspecified abdominal pain: Secondary | ICD-10-CM | POA: Diagnosis not present

## 2012-05-21 DIAGNOSIS — N390 Urinary tract infection, site not specified: Secondary | ICD-10-CM | POA: Diagnosis not present

## 2012-05-21 DIAGNOSIS — M546 Pain in thoracic spine: Secondary | ICD-10-CM | POA: Diagnosis not present

## 2012-05-23 DIAGNOSIS — M5137 Other intervertebral disc degeneration, lumbosacral region: Secondary | ICD-10-CM | POA: Diagnosis not present

## 2012-06-12 DIAGNOSIS — B9789 Other viral agents as the cause of diseases classified elsewhere: Secondary | ICD-10-CM | POA: Diagnosis not present

## 2012-06-12 DIAGNOSIS — R52 Pain, unspecified: Secondary | ICD-10-CM | POA: Diagnosis not present

## 2012-06-19 DIAGNOSIS — R5383 Other fatigue: Secondary | ICD-10-CM | POA: Diagnosis not present

## 2012-06-19 DIAGNOSIS — M545 Low back pain, unspecified: Secondary | ICD-10-CM | POA: Diagnosis not present

## 2012-06-19 DIAGNOSIS — M546 Pain in thoracic spine: Secondary | ICD-10-CM | POA: Diagnosis not present

## 2012-06-19 DIAGNOSIS — R5381 Other malaise: Secondary | ICD-10-CM | POA: Diagnosis not present

## 2012-06-27 DIAGNOSIS — R310 Gross hematuria: Secondary | ICD-10-CM | POA: Diagnosis not present

## 2012-06-27 DIAGNOSIS — R339 Retention of urine, unspecified: Secondary | ICD-10-CM | POA: Diagnosis not present

## 2012-06-27 DIAGNOSIS — N319 Neuromuscular dysfunction of bladder, unspecified: Secondary | ICD-10-CM | POA: Diagnosis not present

## 2012-07-03 DIAGNOSIS — S92309A Fracture of unspecified metatarsal bone(s), unspecified foot, initial encounter for closed fracture: Secondary | ICD-10-CM | POA: Diagnosis not present

## 2012-07-09 DIAGNOSIS — R310 Gross hematuria: Secondary | ICD-10-CM | POA: Diagnosis not present

## 2012-07-09 DIAGNOSIS — N319 Neuromuscular dysfunction of bladder, unspecified: Secondary | ICD-10-CM | POA: Diagnosis not present

## 2012-07-10 DIAGNOSIS — S92309A Fracture of unspecified metatarsal bone(s), unspecified foot, initial encounter for closed fracture: Secondary | ICD-10-CM | POA: Diagnosis not present

## 2012-07-18 DIAGNOSIS — R5383 Other fatigue: Secondary | ICD-10-CM | POA: Diagnosis not present

## 2012-07-18 DIAGNOSIS — F988 Other specified behavioral and emotional disorders with onset usually occurring in childhood and adolescence: Secondary | ICD-10-CM | POA: Diagnosis not present

## 2012-07-18 DIAGNOSIS — M546 Pain in thoracic spine: Secondary | ICD-10-CM | POA: Diagnosis not present

## 2012-07-18 DIAGNOSIS — E236 Other disorders of pituitary gland: Secondary | ICD-10-CM | POA: Diagnosis not present

## 2012-07-18 DIAGNOSIS — M545 Low back pain, unspecified: Secondary | ICD-10-CM | POA: Diagnosis not present

## 2012-07-18 DIAGNOSIS — Z124 Encounter for screening for malignant neoplasm of cervix: Secondary | ICD-10-CM | POA: Diagnosis not present

## 2012-07-18 DIAGNOSIS — M8430XA Stress fracture, unspecified site, initial encounter for fracture: Secondary | ICD-10-CM | POA: Diagnosis not present

## 2012-07-18 DIAGNOSIS — R5381 Other malaise: Secondary | ICD-10-CM | POA: Diagnosis not present

## 2012-07-18 DIAGNOSIS — Z0000 Encounter for general adult medical examination without abnormal findings: Secondary | ICD-10-CM | POA: Diagnosis not present

## 2012-07-29 DIAGNOSIS — S92309A Fracture of unspecified metatarsal bone(s), unspecified foot, initial encounter for closed fracture: Secondary | ICD-10-CM | POA: Diagnosis not present

## 2012-08-21 DIAGNOSIS — M542 Cervicalgia: Secondary | ICD-10-CM | POA: Diagnosis not present

## 2012-08-21 DIAGNOSIS — N319 Neuromuscular dysfunction of bladder, unspecified: Secondary | ICD-10-CM | POA: Diagnosis not present

## 2012-08-21 DIAGNOSIS — N390 Urinary tract infection, site not specified: Secondary | ICD-10-CM | POA: Diagnosis not present

## 2012-08-21 DIAGNOSIS — R350 Frequency of micturition: Secondary | ICD-10-CM | POA: Diagnosis not present

## 2012-08-21 DIAGNOSIS — M545 Low back pain, unspecified: Secondary | ICD-10-CM | POA: Diagnosis not present

## 2012-08-21 DIAGNOSIS — G8220 Paraplegia, unspecified: Secondary | ICD-10-CM | POA: Diagnosis not present

## 2012-08-21 DIAGNOSIS — F988 Other specified behavioral and emotional disorders with onset usually occurring in childhood and adolescence: Secondary | ICD-10-CM | POA: Diagnosis not present

## 2012-08-21 DIAGNOSIS — M546 Pain in thoracic spine: Secondary | ICD-10-CM | POA: Diagnosis not present

## 2012-09-26 DIAGNOSIS — F988 Other specified behavioral and emotional disorders with onset usually occurring in childhood and adolescence: Secondary | ICD-10-CM | POA: Diagnosis not present

## 2012-09-26 DIAGNOSIS — M546 Pain in thoracic spine: Secondary | ICD-10-CM | POA: Diagnosis not present

## 2012-09-26 DIAGNOSIS — M545 Low back pain, unspecified: Secondary | ICD-10-CM | POA: Diagnosis not present

## 2012-09-26 DIAGNOSIS — M9981 Other biomechanical lesions of cervical region: Secondary | ICD-10-CM | POA: Diagnosis not present

## 2012-09-26 DIAGNOSIS — G8220 Paraplegia, unspecified: Secondary | ICD-10-CM | POA: Diagnosis not present

## 2012-09-26 DIAGNOSIS — G8110 Spastic hemiplegia affecting unspecified side: Secondary | ICD-10-CM | POA: Diagnosis not present

## 2012-10-01 DIAGNOSIS — M5137 Other intervertebral disc degeneration, lumbosacral region: Secondary | ICD-10-CM | POA: Diagnosis not present

## 2012-10-01 DIAGNOSIS — M542 Cervicalgia: Secondary | ICD-10-CM | POA: Diagnosis not present

## 2012-11-06 DIAGNOSIS — M545 Low back pain, unspecified: Secondary | ICD-10-CM | POA: Diagnosis not present

## 2012-11-06 DIAGNOSIS — H1045 Other chronic allergic conjunctivitis: Secondary | ICD-10-CM | POA: Diagnosis not present

## 2012-11-06 DIAGNOSIS — N390 Urinary tract infection, site not specified: Secondary | ICD-10-CM | POA: Diagnosis not present

## 2012-11-06 DIAGNOSIS — R350 Frequency of micturition: Secondary | ICD-10-CM | POA: Diagnosis not present

## 2012-11-06 DIAGNOSIS — M546 Pain in thoracic spine: Secondary | ICD-10-CM | POA: Diagnosis not present

## 2012-11-06 DIAGNOSIS — F988 Other specified behavioral and emotional disorders with onset usually occurring in childhood and adolescence: Secondary | ICD-10-CM | POA: Diagnosis not present

## 2012-12-03 DIAGNOSIS — F988 Other specified behavioral and emotional disorders with onset usually occurring in childhood and adolescence: Secondary | ICD-10-CM | POA: Diagnosis not present

## 2012-12-03 DIAGNOSIS — G4701 Insomnia due to medical condition: Secondary | ICD-10-CM | POA: Diagnosis not present

## 2012-12-03 DIAGNOSIS — M546 Pain in thoracic spine: Secondary | ICD-10-CM | POA: Diagnosis not present

## 2012-12-03 DIAGNOSIS — M545 Low back pain, unspecified: Secondary | ICD-10-CM | POA: Diagnosis not present

## 2012-12-03 DIAGNOSIS — N390 Urinary tract infection, site not specified: Secondary | ICD-10-CM | POA: Diagnosis not present

## 2012-12-23 DIAGNOSIS — B3731 Acute candidiasis of vulva and vagina: Secondary | ICD-10-CM | POA: Diagnosis not present

## 2012-12-23 DIAGNOSIS — G8220 Paraplegia, unspecified: Secondary | ICD-10-CM | POA: Diagnosis not present

## 2012-12-23 DIAGNOSIS — M546 Pain in thoracic spine: Secondary | ICD-10-CM | POA: Diagnosis not present

## 2012-12-23 DIAGNOSIS — M545 Low back pain, unspecified: Secondary | ICD-10-CM | POA: Diagnosis not present

## 2012-12-23 DIAGNOSIS — B373 Candidiasis of vulva and vagina: Secondary | ICD-10-CM | POA: Diagnosis not present

## 2012-12-23 DIAGNOSIS — N319 Neuromuscular dysfunction of bladder, unspecified: Secondary | ICD-10-CM | POA: Diagnosis not present

## 2012-12-23 DIAGNOSIS — R350 Frequency of micturition: Secondary | ICD-10-CM | POA: Diagnosis not present

## 2013-01-29 DIAGNOSIS — M546 Pain in thoracic spine: Secondary | ICD-10-CM | POA: Diagnosis not present

## 2013-01-29 DIAGNOSIS — Z79899 Other long term (current) drug therapy: Secondary | ICD-10-CM | POA: Diagnosis not present

## 2013-01-29 DIAGNOSIS — R350 Frequency of micturition: Secondary | ICD-10-CM | POA: Diagnosis not present

## 2013-01-29 DIAGNOSIS — D649 Anemia, unspecified: Secondary | ICD-10-CM | POA: Diagnosis not present

## 2013-01-29 DIAGNOSIS — M545 Low back pain, unspecified: Secondary | ICD-10-CM | POA: Diagnosis not present

## 2013-01-29 DIAGNOSIS — F988 Other specified behavioral and emotional disorders with onset usually occurring in childhood and adolescence: Secondary | ICD-10-CM | POA: Diagnosis not present

## 2013-01-29 DIAGNOSIS — G8220 Paraplegia, unspecified: Secondary | ICD-10-CM | POA: Diagnosis not present

## 2013-01-29 DIAGNOSIS — E236 Other disorders of pituitary gland: Secondary | ICD-10-CM | POA: Diagnosis not present

## 2013-02-24 DIAGNOSIS — G8220 Paraplegia, unspecified: Secondary | ICD-10-CM | POA: Diagnosis not present

## 2013-02-24 DIAGNOSIS — M546 Pain in thoracic spine: Secondary | ICD-10-CM | POA: Diagnosis not present

## 2013-02-24 DIAGNOSIS — M545 Low back pain, unspecified: Secondary | ICD-10-CM | POA: Diagnosis not present

## 2013-03-24 DIAGNOSIS — R350 Frequency of micturition: Secondary | ICD-10-CM | POA: Diagnosis not present

## 2013-03-24 DIAGNOSIS — M545 Low back pain, unspecified: Secondary | ICD-10-CM | POA: Diagnosis not present

## 2013-03-24 DIAGNOSIS — Z23 Encounter for immunization: Secondary | ICD-10-CM | POA: Diagnosis not present

## 2013-03-24 DIAGNOSIS — G43109 Migraine with aura, not intractable, without status migrainosus: Secondary | ICD-10-CM | POA: Diagnosis not present

## 2013-03-24 DIAGNOSIS — F411 Generalized anxiety disorder: Secondary | ICD-10-CM | POA: Diagnosis not present

## 2013-03-24 DIAGNOSIS — M546 Pain in thoracic spine: Secondary | ICD-10-CM | POA: Diagnosis not present

## 2013-03-24 DIAGNOSIS — M542 Cervicalgia: Secondary | ICD-10-CM | POA: Diagnosis not present

## 2013-04-21 DIAGNOSIS — N319 Neuromuscular dysfunction of bladder, unspecified: Secondary | ICD-10-CM | POA: Diagnosis not present

## 2013-04-21 DIAGNOSIS — M9981 Other biomechanical lesions of cervical region: Secondary | ICD-10-CM | POA: Diagnosis not present

## 2013-04-21 DIAGNOSIS — M545 Low back pain, unspecified: Secondary | ICD-10-CM | POA: Diagnosis not present

## 2013-04-21 DIAGNOSIS — M546 Pain in thoracic spine: Secondary | ICD-10-CM | POA: Diagnosis not present

## 2013-04-21 DIAGNOSIS — F988 Other specified behavioral and emotional disorders with onset usually occurring in childhood and adolescence: Secondary | ICD-10-CM | POA: Diagnosis not present

## 2013-04-21 DIAGNOSIS — M542 Cervicalgia: Secondary | ICD-10-CM | POA: Diagnosis not present

## 2013-04-21 DIAGNOSIS — G8110 Spastic hemiplegia affecting unspecified side: Secondary | ICD-10-CM | POA: Diagnosis not present

## 2013-05-07 DIAGNOSIS — M9981 Other biomechanical lesions of cervical region: Secondary | ICD-10-CM | POA: Diagnosis not present

## 2013-05-07 DIAGNOSIS — R350 Frequency of micturition: Secondary | ICD-10-CM | POA: Diagnosis not present

## 2013-05-07 DIAGNOSIS — G43109 Migraine with aura, not intractable, without status migrainosus: Secondary | ICD-10-CM | POA: Diagnosis not present

## 2013-06-09 DIAGNOSIS — M546 Pain in thoracic spine: Secondary | ICD-10-CM | POA: Diagnosis not present

## 2013-06-09 DIAGNOSIS — M545 Low back pain, unspecified: Secondary | ICD-10-CM | POA: Diagnosis not present

## 2013-06-09 DIAGNOSIS — M542 Cervicalgia: Secondary | ICD-10-CM | POA: Diagnosis not present

## 2013-06-09 DIAGNOSIS — G8110 Spastic hemiplegia affecting unspecified side: Secondary | ICD-10-CM | POA: Diagnosis not present

## 2013-06-09 DIAGNOSIS — M9981 Other biomechanical lesions of cervical region: Secondary | ICD-10-CM | POA: Diagnosis not present

## 2013-06-19 DIAGNOSIS — J3489 Other specified disorders of nose and nasal sinuses: Secondary | ICD-10-CM | POA: Diagnosis not present

## 2013-06-30 DIAGNOSIS — G43109 Migraine with aura, not intractable, without status migrainosus: Secondary | ICD-10-CM | POA: Diagnosis not present

## 2013-06-30 DIAGNOSIS — M542 Cervicalgia: Secondary | ICD-10-CM | POA: Diagnosis not present

## 2013-06-30 DIAGNOSIS — M546 Pain in thoracic spine: Secondary | ICD-10-CM | POA: Diagnosis not present

## 2013-06-30 DIAGNOSIS — M545 Low back pain, unspecified: Secondary | ICD-10-CM | POA: Diagnosis not present

## 2013-07-22 DIAGNOSIS — N3941 Urge incontinence: Secondary | ICD-10-CM | POA: Diagnosis not present

## 2013-07-22 DIAGNOSIS — N319 Neuromuscular dysfunction of bladder, unspecified: Secondary | ICD-10-CM | POA: Diagnosis not present

## 2013-07-24 ENCOUNTER — Other Ambulatory Visit: Payer: Self-pay | Admitting: Urology

## 2013-07-30 ENCOUNTER — Encounter (HOSPITAL_BASED_OUTPATIENT_CLINIC_OR_DEPARTMENT_OTHER): Payer: Self-pay | Admitting: *Deleted

## 2013-07-30 NOTE — Progress Notes (Signed)
NPO AFTER MN. ARRIVE AT 0600. NEEDS HG.  WILL TAKE MS CONTIN, DITROPAN, DEXILANT, AND BACLOFEN AM DOS W/ SIPS OF WATER.

## 2013-07-31 NOTE — H&P (Signed)
History of Present Illness   Ms. Niesen has a stable neurogenic bladder. She says she is getting more frequent urinary tract infections. She is allergic to sulfa and gets headaches with Macrobid. She does not think the trimethoprim helps, but I am not convinced she has been that compliant. Perhaps her compliance is related to lack of efficacy.     She has been having more frequent urinary tract infections. She will get foul-smelling urine. She will have to catheterize more often. Sometimes she has low-grade discomfort or a feeling of general malaise. Intermittently she has had a little bit of blood but has been worked up for this in January 2014.     There is no other modifying factors or associated signs or symptoms. There are no other aggravating or relieving factors. The symptoms are moderate in severity and recurrent.    Urinalysis: Positive bacteria. Urine sent for culture.   Past Medical History Problems  1. History of Anxiety (Symptom) (799.2) 2. History of Convulsions (780.39) 3. History of depression (V11.8) 4. History of spinal cord injury (V12.49) 5. History of Recent Methicillin-resistant Staphylococcus Aureus Infection  Surgical History Problems  1. History of Cystoscopy With Dilation Of Bladder 2. History of Urologic Surgery 3. History of Urologic Surgery 4. History of Urologic Surgery  Current Meds 1. Adderall XR CP24;  Therapy: (Recorded:19Aug2013) to Recorded 2. Advil TABS;  Therapy: (Recorded:19Aug2013) to Recorded 3. Amoxicillin-Pot Clavulanate 875-125 MG Oral Tablet; TAKE 1 TABLET TWICE DAILY  AFTER MEALS;  Therapy: [id number] to (Evaluate:17Jan2014)  Requested for: [id number]; Last  Rx:10Jan2014 Ordered 4. Baclofen 20 MG Oral Tablet;  Therapy: (Recorded:26Nov2007) to Recorded 5. Ciprofloxacin HCl - 250 MG Oral Tablet; TAKE 1 TABLET BID;  Therapy: [id number] to (Evaluate:03Feb2014)  Requested for: 28Jan2014; Last  Rx:27Jan2014 Ordered 6. Cymbalta 60 MG  Oral Capsule Delayed Release Particles;  Therapy: (Recorded:19Aug2013) to Recorded 7. Depakote TBEC;  Therapy: (Recorded:19Aug2013) to Recorded 8. Dexilant 60 MG Oral Capsule Delayed Release;  Therapy: (Recorded:04Feb2015) to Recorded 9. Diazepam 5 MG Oral Tablet;  Therapy: 54GBE0100 to Recorded 10. Ibuprofen 800 MG Oral Tablet;   Therapy: (Recorded:08Nov2011) to Recorded 11. Maxalt TABS;   Therapy: (Recorded:19Aug2013) to Recorded 12. MS Contin 15 MG Oral Tablet Extended Release;   Therapy: (Recorded:04Feb2015) to Recorded 13. MS Contin 15 MG TB12;   Therapy: (Recorded:04Feb2015) to Recorded 14. MSIR 15 MG TABS;   Therapy: (Recorded:04Feb2015) to Recorded 15. Oxybutynin Chloride ER 15 MG Oral Tablet Extended Release 24 Hour; take 1 tablet by   mouth once daily;   Therapy: 02Jan2009 to (Evaluate:13Jul2015)  Requested for: [phone]; Last   Rx:18Jul2014 Ordered 16. Oxytrol 3.9 MG/24HR Transdermal Patch Twice Weekly; PLACE 1 PATCH As Directed   PLEASE CHANGE PATCH EVERY 3 DAYS;   Therapy: [id number] to (Last Rx:19Aug2013)  Requested for: 19Aug2013 Ordered 17. Phenergan TABS;   Therapy: (Recorded:19Aug2013) to Recorded 18. Relpax TABS;   Therapy: (Recorded:04Feb2015) to Recorded 19. Sonata CAPS;   Therapy: (Recorded:19Aug2013) to Recorded 20. SUMAtriptan Succinate Refill 6 MG/0.5ML KIT;   Therapy: (Recorded:04Feb2015) to Recorded 21. Valium 5 MG Oral Tablet;   Therapy: (Recorded:19Aug2013) to Recorded 22. Xanax 0.5 MG Oral Tablet;   Therapy: (Recorded:19Aug2013) to Recorded 23. Zanaflex CAPS;   Therapy: (Recorded:19Aug2013) to Recorded 24. Zofran TABS;   Therapy: (Recorded:19Aug2013) to Recorded  Allergies Medication  1. Wellbutrin 2. CeleBREX CAPS 3. Codeine Derivatives 4. erythromycin 5. Macrobid CAPS 6. Sulfa Drugs 7. Topamax  Family History Problems  1. Family history of Acute Myocardial Infarction : Father 2. Family  history of Acute Myocardial Infarction :  Paternal Grandmother 3. Family history of Brain Tumor : Maternal Grandfather 4. Family history of Brain Tumor : Paternal Uncle 71. Family history of Breast Cancer (V16.3) : Mother 6. Family history of Family Health Status Number Of Children   1 SON, 1 DAUGHTER 7. Family history of Urologic Disorder (V18.7) : Father  Social History Problems  1. Denied: Alcohol Use 2. Caffeine Use   1 OR 2 PER DAY 3. Marital History - Currently Married 4. Never A Smoker 5. Occupation:   DISABLED 6. Denied: Tobacco Use (V15.82)  Vitals Vital Signs [Data Includes: Last 1 Day]  Recorded: [id number] 11:06AM  Height: 5 ft 4 in Weight: 115 lb  BMI Calculated: 19.74 BSA Calculated: 1.55 Blood Pressure: 131 / 82, Sitting Temperature: 98.8 F, Oral Heart Rate: 91 Respiration: 20  Results/Data  22 Jul 2013 10:41 AM  UA With REFLEX    COLOR YELLOW     APPEARANCE CLOUDY     SPECIFIC GRAVITY 1.020     pH 7.0     GLUCOSE NEG     BILIRUBIN NEG     KETONE NEG     BLOOD NEG     PROTEIN NEG     UROBILINOGEN 0.2     NITRITE POS     LEUKOCYTE ESTERASE NEG     SQUAMOUS EPITHELIAL/HPF NONE SEEN     WBC 0-2     CRYSTALS NONE SEEN     CASTS NONE SEEN     RBC 0-2     BACTERIA MANY     Assessment Assessed  1. Neurogenic bladder (596.54) 2. Urge incontinence of urine (788.31)  Plan  Incomplete bladder emptying, Urinary tract infection  1. Start: Ciprofloxacin HCl - 250 MG Oral Tablet (Cipro); Take 1 tablet twice daily Neurogenic bladder  2. Follow-up Schedule Surgery Office  Follow-up  Status: Hold For - Appointment   Requested for: [id number]  Discussion/Summary   I agree that Derra is a good candidate for her repeat Botox. She has neurogenic detrusor overactivity with urgency incontinence. Her last Botox was in December 2012. I will call if the culture is positive and place her on an antibiotic full strength until we do a Botox, hopefully next week. I am not certain if prophylaxis is going to  make much difference. I do not think she has tried daily Keflex and this will discussed moving forward.    I gave Luella a 3-day prescription of ciprofloxacin as per protocol if the culture is negative.    And 400 units has been used on multiple occasions in the past.  After a thorough review of the management options for the patient's condition the patient  elected to proceed with surgical therapy as noted above. We have discussed the potential benefits and risks of the procedure, side effects of the proposed treatment, the likelihood of the patient achieving the goals of the procedure, and any potential problems that might occur during the procedure or recuperation. Informed consent has been obtained.

## 2013-08-02 NOTE — Anesthesia Preprocedure Evaluation (Addendum)
Anesthesia Evaluation  Patient identified by MRN, date of birth, ID band Patient awake    Reviewed: Allergy & Precautions, H&P , NPO status , Patient's Chart, lab work & pertinent test results  Airway Mallampati: II TM Distance: >3 FB Neck ROM: full    Dental no notable dental hx. (+) Teeth Intact, Dental Advisory Given   Pulmonary neg pulmonary ROS,  breath sounds clear to auscultation  Pulmonary exam normal       Cardiovascular Exercise Tolerance: Good negative cardio ROS  Rhythm:regular Rate:Normal     Neuro/Psych  Headaches, Spinal cord injury secondary to hematoma from epidural in child birth with neurogenic bladder, foot drop, and numbness and weakness LE negative psych ROS   GI/Hepatic negative GI ROS, Neg liver ROS,   Endo/Other  negative endocrine ROS  Renal/GU negative Renal ROS  negative genitourinary   Musculoskeletal   Abdominal   Peds  Hematology negative hematology ROS (+)   Anesthesia Other Findings   Reproductive/Obstetrics negative OB ROS                           Anesthesia Physical Anesthesia Plan  ASA: III  Anesthesia Plan: General   Post-op Pain Management:    Induction: Intravenous  Airway Management Planned: LMA  Additional Equipment:   Intra-op Plan:   Post-operative Plan:   Informed Consent: I have reviewed the patients History and Physical, chart, labs and discussed the procedure including the risks, benefits and alternatives for the proposed anesthesia with the patient or authorized representative who has indicated his/her understanding and acceptance.   Dental Advisory Given  Plan Discussed with: CRNA and Surgeon  Anesthesia Plan Comments:         Anesthesia Quick Evaluation

## 2013-08-03 ENCOUNTER — Encounter (HOSPITAL_COMMUNITY): Admission: RE | Payer: Self-pay | Source: Ambulatory Visit

## 2013-08-03 ENCOUNTER — Encounter (HOSPITAL_BASED_OUTPATIENT_CLINIC_OR_DEPARTMENT_OTHER): Payer: BC Managed Care – PPO | Admitting: Anesthesiology

## 2013-08-03 ENCOUNTER — Encounter (HOSPITAL_BASED_OUTPATIENT_CLINIC_OR_DEPARTMENT_OTHER): Payer: Self-pay | Admitting: Certified Registered"

## 2013-08-03 ENCOUNTER — Ambulatory Visit (HOSPITAL_BASED_OUTPATIENT_CLINIC_OR_DEPARTMENT_OTHER)
Admission: RE | Admit: 2013-08-03 | Discharge: 2013-08-03 | Disposition: A | Discharge status: Home / Self Care | Payer: BC Managed Care – PPO | Source: Ambulatory Visit | Attending: Urology | Admitting: Urology

## 2013-08-03 ENCOUNTER — Ambulatory Visit (HOSPITAL_BASED_OUTPATIENT_CLINIC_OR_DEPARTMENT_OTHER): Payer: BC Managed Care – PPO | Admitting: Anesthesiology

## 2013-08-03 ENCOUNTER — Encounter (HOSPITAL_BASED_OUTPATIENT_CLINIC_OR_DEPARTMENT_OTHER)
Admission: RE | Disposition: A | Discharge status: Home / Self Care | Payer: Self-pay | Source: Ambulatory Visit | Attending: Urology

## 2013-08-03 ENCOUNTER — Ambulatory Visit (HOSPITAL_COMMUNITY): Admission: RE | Admit: 2013-08-03 | Payer: BC Managed Care – PPO | Source: Ambulatory Visit | Admitting: Urology

## 2013-08-03 DIAGNOSIS — Z79899 Other long term (current) drug therapy: Secondary | ICD-10-CM | POA: Insufficient documentation

## 2013-08-03 DIAGNOSIS — IMO0002 Reserved for concepts with insufficient information to code with codable children: Secondary | ICD-10-CM | POA: Diagnosis not present

## 2013-08-03 DIAGNOSIS — R569 Unspecified convulsions: Secondary | ICD-10-CM | POA: Diagnosis not present

## 2013-08-03 DIAGNOSIS — N3289 Other specified disorders of bladder: Secondary | ICD-10-CM | POA: Insufficient documentation

## 2013-08-03 DIAGNOSIS — F329 Major depressive disorder, single episode, unspecified: Secondary | ICD-10-CM | POA: Diagnosis not present

## 2013-08-03 DIAGNOSIS — N3941 Urge incontinence: Secondary | ICD-10-CM | POA: Diagnosis not present

## 2013-08-03 DIAGNOSIS — Z87440 Personal history of urinary (tract) infections: Secondary | ICD-10-CM | POA: Diagnosis not present

## 2013-08-03 DIAGNOSIS — N319 Neuromuscular dysfunction of bladder, unspecified: Secondary | ICD-10-CM | POA: Diagnosis not present

## 2013-08-03 DIAGNOSIS — F3289 Other specified depressive episodes: Secondary | ICD-10-CM | POA: Insufficient documentation

## 2013-08-03 DIAGNOSIS — F411 Generalized anxiety disorder: Secondary | ICD-10-CM | POA: Diagnosis not present

## 2013-08-03 DIAGNOSIS — Z8614 Personal history of Methicillin resistant Staphylococcus aureus infection: Secondary | ICD-10-CM | POA: Insufficient documentation

## 2013-08-03 DIAGNOSIS — Y849 Medical procedure, unspecified as the cause of abnormal reaction of the patient, or of later complication, without mention of misadventure at the time of the procedure: Secondary | ICD-10-CM | POA: Insufficient documentation

## 2013-08-03 HISTORY — PX: CYSTOSCOPY WITH INJECTION: SHX1424

## 2013-08-03 LAB — POCT HEMOGLOBIN-HEMACUE: Hemoglobin: 11.6 g/dL — ABNORMAL LOW (ref 12.0–15.0)

## 2013-08-03 SURGERY — CYSTOSCOPY, WITH INJECTION OF BLADDER NECK OR BLADDER WALL
Anesthesia: General

## 2013-08-03 SURGERY — CYSTOSCOPY, WITH INJECTION OF BLADDER NECK OR BLADDER WALL
Anesthesia: General | Site: Bladder

## 2013-08-03 MED ORDER — DEXAMETHASONE SODIUM PHOSPHATE 4 MG/ML IJ SOLN
INTRAMUSCULAR | Status: DC | PRN
  Administered 2013-08-03: 8 mg via INTRAVENOUS

## 2013-08-03 MED ORDER — LIDOCAINE HCL (CARDIAC) 20 MG/ML IV SOLN
INTRAVENOUS | Status: DC | PRN
  Administered 2013-08-03: 60 mg via INTRAVENOUS

## 2013-08-03 MED ORDER — PROPOFOL 10 MG/ML IV BOLUS
INTRAVENOUS | Status: DC | PRN
  Administered 2013-08-03: 200 mg via INTRAVENOUS

## 2013-08-03 MED ORDER — MIDAZOLAM HCL 5 MG/5ML IJ SOLN
INTRAMUSCULAR | Status: DC | PRN
  Administered 2013-08-03: 2 mg via INTRAVENOUS

## 2013-08-03 MED ORDER — LACTATED RINGERS IV SOLN
INTRAVENOUS | Status: DC
  Filled 2013-08-03: qty 1000

## 2013-08-03 MED ORDER — FENTANYL CITRATE 0.05 MG/ML IJ SOLN
25.0000 ug | INTRAMUSCULAR | Status: DC | PRN
  Filled 2013-08-03: qty 1

## 2013-08-03 MED ORDER — LACTATED RINGERS IV SOLN
INTRAVENOUS | Status: DC
  Administered 2013-08-03: 07:00:00 via INTRAVENOUS
  Filled 2013-08-03: qty 1000

## 2013-08-03 MED ORDER — CIPROFLOXACIN IN D5W 400 MG/200ML IV SOLN
400.0000 mg | INTRAVENOUS | Status: AC
  Administered 2013-08-03: 400 mg via INTRAVENOUS
  Filled 2013-08-03: qty 200

## 2013-08-03 MED ORDER — OXYCODONE-ACETAMINOPHEN 5-325 MG PO TABS: 1.0000 | ORAL_TABLET | Freq: Four times a day (QID) | ORAL | Status: DC | PRN

## 2013-08-03 MED ORDER — FENTANYL CITRATE 0.05 MG/ML IJ SOLN
INTRAMUSCULAR | Status: DC | PRN
  Administered 2013-08-03: 50 ug via INTRAVENOUS

## 2013-08-03 MED ORDER — STERILE WATER FOR IRRIGATION IR SOLN
Status: DC | PRN
  Administered 2013-08-03: 3000 mL

## 2013-08-03 MED ORDER — ONDANSETRON HCL 4 MG/2ML IJ SOLN
INTRAMUSCULAR | Status: DC | PRN
  Administered 2013-08-03: 4 mg via INTRAVENOUS

## 2013-08-03 MED ORDER — ONABOTULINUMTOXINA 100 UNITS IJ SOLR
INTRAMUSCULAR | Status: DC | PRN
  Administered 2013-08-03: 300 [IU] via INTRAMUSCULAR

## 2013-08-03 MED ORDER — SODIUM CHLORIDE 0.9 % IJ SOLN
INTRAMUSCULAR | Status: DC | PRN
  Administered 2013-08-03: 30 mL via INTRAVENOUS

## 2013-08-03 SURGICAL SUPPLY — 21 items
BAG DRAIN URO-CYSTO SKYTR STRL (DRAIN) ×2 IMPLANT
BAG DRN UROCATH (DRAIN) ×1
CANISTER SUCT LVC 12 LTR MEDI- (MISCELLANEOUS) ×2 IMPLANT
CATH ROBINSON RED A/P 12FR (CATHETERS) IMPLANT
CLOTH BEACON ORANGE TIMEOUT ST (SAFETY) ×2 IMPLANT
DRAPE CAMERA CLOSED 9X96 (DRAPES) ×2 IMPLANT
ELECT REM PT RETURN 9FT ADLT (ELECTROSURGICAL)
ELECTRODE REM PT RTRN 9FT ADLT (ELECTROSURGICAL) IMPLANT
GLOVE BIO SURGEON STRL SZ7.5 (GLOVE) ×2 IMPLANT
GLOVE BIOGEL PI IND STRL 7.5 (GLOVE) ×1 IMPLANT
GLOVE BIOGEL PI INDICATOR 7.5 (GLOVE) ×1
GLOVE SURG SS PI 7.5 STRL IVOR (GLOVE) ×2 IMPLANT
GOWN PREVENTION PLUS LG XLONG (DISPOSABLE) IMPLANT
GOWN STRL REIN XL XLG (GOWN DISPOSABLE) IMPLANT
GOWN STRL REUS W/TWL XL LVL3 (GOWN DISPOSABLE) ×2 IMPLANT
NDL SAFETY ECLIPSE 18X1.5 (NEEDLE) ×1 IMPLANT
NEEDLE HYPO 18GX1.5 SHARP (NEEDLE) ×2
PACK CYSTOSCOPY (CUSTOM PROCEDURE TRAY) ×2 IMPLANT
SYR 20CC LL (SYRINGE) ×2 IMPLANT
SYR BULB IRRIGATION 50ML (SYRINGE) IMPLANT
WATER STERILE IRR 3000ML UROMA (IV SOLUTION) ×2 IMPLANT

## 2013-08-03 NOTE — Anesthesia Postprocedure Evaluation (Signed)
  Anesthesia Post-op Note  Patient: Melissa Mills  Procedure(s) Performed: Procedure(s) (LRB): CYSTOSCOPY WITH BOTOX INJECTION (N/A)  Patient Location: PACU  Anesthesia Type: General  Level of Consciousness: awake and alert   Airway and Oxygen Therapy: Patient Spontanous Breathing  Post-op Pain: mild  Post-op Assessment: Post-op Vital signs reviewed, Patient's Cardiovascular Status Stable, Respiratory Function Stable, Patent Airway and No signs of Nausea or vomiting  Last Vitals:  Filed Vitals:   08/03/13 0800  BP:   Pulse:   Temp: 36.5 C  Resp:     Post-op Vital Signs: stable   Complications: No apparent anesthesia complications

## 2013-08-03 NOTE — Op Note (Signed)
Preoperative diagnosis: Neurogenic detrusor overactivity and urgency incontinence Postoperative diagnosis: Neurogenic detrusor overactivity and urgency incontinence Surgery: Cystoscopy and injection of Botox Surgeon: Dr. Nicki Reaper Azora Bonzo  The patient has the above diagnoses and consented above procedure. Preoperative weight her urine was sterilized as documented. Extra care was taken with leg positioning to minimize the risk of compartment syndrome and neuropathy and deep vein thrombosis  The ACMI scope was utilized. She grade 2-4 bladder trabeculation. Trigone was noted. There is no cystitis.  Using my usual template at 5 and 0:10 and cephalad to the inter-ureteric ridge I injected 300 cc of botulinum toxin diluted in 30 cc of saline.  There was no bleeding. Bladder was emptied. Patient was taken to recover room.

## 2013-08-03 NOTE — Interval H&P Note (Signed)
History and Physical Interval Note:  08/03/2013 7:16 AM  Melissa Mills  has presented today for surgery, with the diagnosis of urge incontinence  The various methods of treatment have been discussed with the patient and family. After consideration of risks, benefits and other options for treatment, the patient has consented to  Procedure(s): CYSTOSCOPY WITH BOTOX INJECTION (N/A) as a surgical intervention .  The patient's history has been reviewed, patient examined, no change in status, stable for surgery.  I have reviewed the patient's chart and labs.  Questions were answered to the patient's satisfaction.     Kelaiah Escalona A

## 2013-08-03 NOTE — Transfer of Care (Signed)
Immediate Anesthesia Transfer of Care Note  Patient: Melissa Mills  Procedure(s) Performed: Procedure(s) (LRB): CYSTOSCOPY WITH BOTOX INJECTION (N/A)  Patient Location: PACU  Anesthesia Type: General  Level of Consciousness: awake, oriented, sedated and patient cooperative  Airway & Oxygen Therapy: Patient Spontanous Breathing and Patient connected to face mask oxygen  Post-op Assessment: Report given to PACU RN and Post -op Vital signs reviewed and stable  Post vital signs: Reviewed and stable  Complications: No apparent anesthesia complications

## 2013-08-03 NOTE — Progress Notes (Signed)
Paged Dr. Matilde Sprang and called back, okay to be discharged.

## 2013-08-03 NOTE — Anesthesia Procedure Notes (Signed)
Procedure Name: LMA Insertion Date/Time: 08/03/2013 7:37 AM Performed by: Denna Haggard D Pre-anesthesia Checklist: Patient identified, Emergency Drugs available, Suction available and Patient being monitored Patient Re-evaluated:Patient Re-evaluated prior to inductionOxygen Delivery Method: Circle System Utilized Preoxygenation: Pre-oxygenation with 100% oxygen Intubation Type: IV induction Ventilation: Mask ventilation without difficulty LMA: LMA inserted LMA Size: 4.0 Number of attempts: 1 Airway Equipment and Method: bite block Placement Confirmation: positive ETCO2 Tube secured with: Tape Dental Injury: Teeth and Oropharynx as per pre-operative assessment

## 2013-08-03 NOTE — Discharge Instructions (Signed)
I have reviewed discharge instructions in detail with the patient. They will follow-up with me or their physician as scheduled. My nurse will also be calling the patients as per protocol.  °Post Anesthesia Home Care Instructions ° °Activity: °Get plenty of rest for the remainder of the day. A responsible adult should stay with you for 24 hours following the procedure.  °For the next 24 hours, DO NOT: °-Drive a car °-Operate machinery °-Drink alcoholic beverages °-Take any medication unless instructed by your physician °-Make any legal decisions or sign important papers. ° °Meals: °Start with liquid foods such as gelatin or soup. Progress to regular foods as tolerated. Avoid greasy, spicy, heavy foods. If nausea and/or vomiting occur, drink only clear liquids until the nausea and/or vomiting subsides. Call your physician if vomiting continues. ° °Special Instructions/Symptoms: °Your throat may feel dry or sore from the anesthesia or the breathing tube placed in your throat during surgery. If this causes discomfort, gargle with warm salt water. The discomfort should disappear within 24 hours. °CYSTOSCOPY HOME CARE INSTRUCTIONS ° °Activity: °Rest for the remainder of the day.  Do not drive or operate equipment today.  You may resume normal activities in one to two days as instructed by your physician.  ° °Meals: °Drink plenty of liquids and eat light foods such as gelatin or soup this evening.  You may return to a normal meal plan tomorrow. ° °Return to Work: °You may return to work in one to two days or as instructed by your physician. ° °Special Instructions / Symptoms: °Call your physician if any of these symptoms occur: ° ° -persistent or heavy bleeding ° -bleeding which continues after first few urination ° -large blood clots that are difficult to pass ° -urine stream diminishes or stops completely ° -fever equal to or higher than 101 degrees Farenheit. ° -cloudy urine with a strong, foul odor ° -severe  pain ° °Females should always wipe from front to back after elimination.  You may feel some burning pain when you urinate.  This should disappear with time.  Applying moist heat to the lower abdomen or a hot tub bath may help relieve the pain. \ ° °Follow-Up / Date of Return Visit to Your Physician:  *** °Call for an appointment to arrange follow-up. ° °Patient Signature:  ________________________________________________________ ° °Nurse's Signature:  ________________________________________________________ ° °

## 2013-08-05 ENCOUNTER — Encounter (HOSPITAL_BASED_OUTPATIENT_CLINIC_OR_DEPARTMENT_OTHER): Payer: Self-pay | Admitting: Urology

## 2013-08-18 DIAGNOSIS — M545 Low back pain, unspecified: Secondary | ICD-10-CM | POA: Diagnosis not present

## 2013-08-18 DIAGNOSIS — M546 Pain in thoracic spine: Secondary | ICD-10-CM | POA: Diagnosis not present

## 2013-08-18 DIAGNOSIS — F411 Generalized anxiety disorder: Secondary | ICD-10-CM | POA: Diagnosis not present

## 2013-08-18 DIAGNOSIS — F988 Other specified behavioral and emotional disorders with onset usually occurring in childhood and adolescence: Secondary | ICD-10-CM | POA: Diagnosis not present

## 2013-08-18 DIAGNOSIS — Z124 Encounter for screening for malignant neoplasm of cervix: Secondary | ICD-10-CM | POA: Diagnosis not present

## 2013-08-18 DIAGNOSIS — G8220 Paraplegia, unspecified: Secondary | ICD-10-CM | POA: Diagnosis not present

## 2013-08-18 DIAGNOSIS — G43109 Migraine with aura, not intractable, without status migrainosus: Secondary | ICD-10-CM | POA: Diagnosis not present

## 2013-08-18 DIAGNOSIS — Z0000 Encounter for general adult medical examination without abnormal findings: Secondary | ICD-10-CM | POA: Diagnosis not present

## 2013-09-14 DIAGNOSIS — M9981 Other biomechanical lesions of cervical region: Secondary | ICD-10-CM | POA: Diagnosis not present

## 2013-09-14 DIAGNOSIS — M79609 Pain in unspecified limb: Secondary | ICD-10-CM | POA: Diagnosis not present

## 2013-09-14 DIAGNOSIS — M545 Low back pain, unspecified: Secondary | ICD-10-CM | POA: Diagnosis not present

## 2013-09-14 DIAGNOSIS — M542 Cervicalgia: Secondary | ICD-10-CM | POA: Diagnosis not present

## 2013-09-14 DIAGNOSIS — M546 Pain in thoracic spine: Secondary | ICD-10-CM | POA: Diagnosis not present

## 2013-11-06 DIAGNOSIS — R350 Frequency of micturition: Secondary | ICD-10-CM | POA: Diagnosis not present

## 2013-11-06 DIAGNOSIS — N390 Urinary tract infection, site not specified: Secondary | ICD-10-CM | POA: Diagnosis not present

## 2013-11-18 DIAGNOSIS — Z79899 Other long term (current) drug therapy: Secondary | ICD-10-CM | POA: Diagnosis not present

## 2013-11-18 DIAGNOSIS — M5137 Other intervertebral disc degeneration, lumbosacral region: Secondary | ICD-10-CM | POA: Diagnosis not present

## 2013-11-18 DIAGNOSIS — S24101A Unspecified injury at T1 level of thoracic spinal cord, initial encounter: Secondary | ICD-10-CM | POA: Diagnosis not present

## 2014-06-28 DIAGNOSIS — L700 Acne vulgaris: Secondary | ICD-10-CM | POA: Diagnosis not present

## 2014-06-28 DIAGNOSIS — G43109 Migraine with aura, not intractable, without status migrainosus: Secondary | ICD-10-CM | POA: Diagnosis not present

## 2014-06-28 DIAGNOSIS — Z87828 Personal history of other (healed) physical injury and trauma: Secondary | ICD-10-CM | POA: Diagnosis not present

## 2014-06-28 DIAGNOSIS — M545 Low back pain: Secondary | ICD-10-CM | POA: Diagnosis not present

## 2014-06-28 DIAGNOSIS — M542 Cervicalgia: Secondary | ICD-10-CM | POA: Diagnosis not present

## 2014-06-28 DIAGNOSIS — G8111 Spastic hemiplegia affecting right dominant side: Secondary | ICD-10-CM | POA: Diagnosis not present

## 2014-06-28 DIAGNOSIS — F900 Attention-deficit hyperactivity disorder, predominantly inattentive type: Secondary | ICD-10-CM | POA: Diagnosis not present

## 2014-06-28 DIAGNOSIS — K219 Gastro-esophageal reflux disease without esophagitis: Secondary | ICD-10-CM | POA: Diagnosis not present

## 2014-06-28 DIAGNOSIS — N319 Neuromuscular dysfunction of bladder, unspecified: Secondary | ICD-10-CM | POA: Diagnosis not present

## 2014-06-28 DIAGNOSIS — M546 Pain in thoracic spine: Secondary | ICD-10-CM | POA: Diagnosis not present

## 2014-06-28 DIAGNOSIS — J029 Acute pharyngitis, unspecified: Secondary | ICD-10-CM | POA: Diagnosis not present

## 2014-07-20 DIAGNOSIS — Z87828 Personal history of other (healed) physical injury and trauma: Secondary | ICD-10-CM | POA: Diagnosis not present

## 2014-07-20 DIAGNOSIS — M542 Cervicalgia: Secondary | ICD-10-CM | POA: Diagnosis not present

## 2014-07-20 DIAGNOSIS — F900 Attention-deficit hyperactivity disorder, predominantly inattentive type: Secondary | ICD-10-CM | POA: Diagnosis not present

## 2014-07-20 DIAGNOSIS — M546 Pain in thoracic spine: Secondary | ICD-10-CM | POA: Diagnosis not present

## 2014-07-20 DIAGNOSIS — M545 Low back pain: Secondary | ICD-10-CM | POA: Diagnosis not present

## 2014-07-20 DIAGNOSIS — R8290 Unspecified abnormal findings in urine: Secondary | ICD-10-CM | POA: Diagnosis not present

## 2014-07-26 DIAGNOSIS — M79671 Pain in right foot: Secondary | ICD-10-CM | POA: Diagnosis not present

## 2014-07-26 DIAGNOSIS — G959 Disease of spinal cord, unspecified: Secondary | ICD-10-CM | POA: Diagnosis not present

## 2014-07-26 DIAGNOSIS — R252 Cramp and spasm: Secondary | ICD-10-CM | POA: Diagnosis not present

## 2014-07-28 DIAGNOSIS — N3020 Other chronic cystitis without hematuria: Secondary | ICD-10-CM | POA: Diagnosis not present

## 2014-07-28 DIAGNOSIS — N3941 Urge incontinence: Secondary | ICD-10-CM | POA: Diagnosis not present

## 2014-07-28 DIAGNOSIS — N319 Neuromuscular dysfunction of bladder, unspecified: Secondary | ICD-10-CM | POA: Diagnosis not present

## 2014-08-17 DIAGNOSIS — M546 Pain in thoracic spine: Secondary | ICD-10-CM | POA: Diagnosis not present

## 2014-08-17 DIAGNOSIS — Z87828 Personal history of other (healed) physical injury and trauma: Secondary | ICD-10-CM | POA: Diagnosis not present

## 2014-08-17 DIAGNOSIS — M542 Cervicalgia: Secondary | ICD-10-CM | POA: Diagnosis not present

## 2014-08-17 DIAGNOSIS — F900 Attention-deficit hyperactivity disorder, predominantly inattentive type: Secondary | ICD-10-CM | POA: Diagnosis not present

## 2014-08-17 DIAGNOSIS — M545 Low back pain: Secondary | ICD-10-CM | POA: Diagnosis not present

## 2014-09-14 DIAGNOSIS — Z30433 Encounter for removal and reinsertion of intrauterine contraceptive device: Secondary | ICD-10-CM | POA: Diagnosis not present

## 2014-09-14 DIAGNOSIS — M546 Pain in thoracic spine: Secondary | ICD-10-CM | POA: Diagnosis not present

## 2014-09-14 DIAGNOSIS — Z87828 Personal history of other (healed) physical injury and trauma: Secondary | ICD-10-CM | POA: Diagnosis not present

## 2014-09-14 DIAGNOSIS — F900 Attention-deficit hyperactivity disorder, predominantly inattentive type: Secondary | ICD-10-CM | POA: Diagnosis not present

## 2014-09-14 DIAGNOSIS — M542 Cervicalgia: Secondary | ICD-10-CM | POA: Diagnosis not present

## 2014-09-14 DIAGNOSIS — N390 Urinary tract infection, site not specified: Secondary | ICD-10-CM | POA: Diagnosis not present

## 2014-09-14 DIAGNOSIS — Z7952 Long term (current) use of systemic steroids: Secondary | ICD-10-CM | POA: Diagnosis not present

## 2014-09-14 DIAGNOSIS — D649 Anemia, unspecified: Secondary | ICD-10-CM | POA: Diagnosis not present

## 2014-09-14 DIAGNOSIS — Z124 Encounter for screening for malignant neoplasm of cervix: Secondary | ICD-10-CM | POA: Diagnosis not present

## 2014-09-14 DIAGNOSIS — Z0001 Encounter for general adult medical examination with abnormal findings: Secondary | ICD-10-CM | POA: Diagnosis not present

## 2014-09-14 DIAGNOSIS — E039 Hypothyroidism, unspecified: Secondary | ICD-10-CM | POA: Diagnosis not present

## 2014-09-14 DIAGNOSIS — E2740 Unspecified adrenocortical insufficiency: Secondary | ICD-10-CM | POA: Diagnosis not present

## 2014-09-14 DIAGNOSIS — M545 Low back pain: Secondary | ICD-10-CM | POA: Diagnosis not present

## 2014-09-14 DIAGNOSIS — N319 Neuromuscular dysfunction of bladder, unspecified: Secondary | ICD-10-CM | POA: Diagnosis not present

## 2014-10-12 DIAGNOSIS — M545 Low back pain: Secondary | ICD-10-CM | POA: Diagnosis not present

## 2014-10-12 DIAGNOSIS — M546 Pain in thoracic spine: Secondary | ICD-10-CM | POA: Diagnosis not present

## 2014-10-12 DIAGNOSIS — N390 Urinary tract infection, site not specified: Secondary | ICD-10-CM | POA: Diagnosis not present

## 2014-10-12 DIAGNOSIS — F900 Attention-deficit hyperactivity disorder, predominantly inattentive type: Secondary | ICD-10-CM | POA: Diagnosis not present

## 2014-10-12 DIAGNOSIS — R238 Other skin changes: Secondary | ICD-10-CM | POA: Diagnosis not present

## 2014-10-12 DIAGNOSIS — L989 Disorder of the skin and subcutaneous tissue, unspecified: Secondary | ICD-10-CM | POA: Diagnosis not present

## 2014-10-12 DIAGNOSIS — M722 Plantar fascial fibromatosis: Secondary | ICD-10-CM | POA: Diagnosis not present

## 2014-10-12 DIAGNOSIS — D2372 Other benign neoplasm of skin of left lower limb, including hip: Secondary | ICD-10-CM | POA: Diagnosis not present

## 2014-11-09 DIAGNOSIS — Z87828 Personal history of other (healed) physical injury and trauma: Secondary | ICD-10-CM | POA: Diagnosis not present

## 2014-11-09 DIAGNOSIS — M542 Cervicalgia: Secondary | ICD-10-CM | POA: Diagnosis not present

## 2014-11-09 DIAGNOSIS — F900 Attention-deficit hyperactivity disorder, predominantly inattentive type: Secondary | ICD-10-CM | POA: Diagnosis not present

## 2014-11-09 DIAGNOSIS — F419 Anxiety disorder, unspecified: Secondary | ICD-10-CM | POA: Diagnosis not present

## 2014-11-09 DIAGNOSIS — N390 Urinary tract infection, site not specified: Secondary | ICD-10-CM | POA: Diagnosis not present

## 2014-11-09 DIAGNOSIS — M545 Low back pain: Secondary | ICD-10-CM | POA: Diagnosis not present

## 2014-11-09 DIAGNOSIS — M722 Plantar fascial fibromatosis: Secondary | ICD-10-CM | POA: Diagnosis not present

## 2014-11-26 DIAGNOSIS — M542 Cervicalgia: Secondary | ICD-10-CM | POA: Diagnosis not present

## 2014-11-26 DIAGNOSIS — I10 Essential (primary) hypertension: Secondary | ICD-10-CM | POA: Diagnosis not present

## 2014-11-26 DIAGNOSIS — G43109 Migraine with aura, not intractable, without status migrainosus: Secondary | ICD-10-CM | POA: Diagnosis not present

## 2015-04-21 DIAGNOSIS — M5442 Lumbago with sciatica, left side: Secondary | ICD-10-CM | POA: Diagnosis not present

## 2015-04-21 DIAGNOSIS — G8929 Other chronic pain: Secondary | ICD-10-CM | POA: Diagnosis not present

## 2015-04-21 DIAGNOSIS — M961 Postlaminectomy syndrome, not elsewhere classified: Secondary | ICD-10-CM | POA: Diagnosis not present

## 2015-04-21 DIAGNOSIS — S24109S Unspecified injury at unspecified level of thoracic spinal cord, sequela: Secondary | ICD-10-CM | POA: Diagnosis not present

## 2015-04-21 DIAGNOSIS — M5137 Other intervertebral disc degeneration, lumbosacral region: Secondary | ICD-10-CM | POA: Diagnosis not present

## 2015-04-21 DIAGNOSIS — Z79891 Long term (current) use of opiate analgesic: Secondary | ICD-10-CM | POA: Diagnosis not present

## 2015-05-31 DIAGNOSIS — M5416 Radiculopathy, lumbar region: Secondary | ICD-10-CM | POA: Diagnosis not present

## 2015-05-31 DIAGNOSIS — G8222 Paraplegia, incomplete: Secondary | ICD-10-CM | POA: Diagnosis not present

## 2015-05-31 DIAGNOSIS — G894 Chronic pain syndrome: Secondary | ICD-10-CM | POA: Diagnosis not present

## 2015-05-31 DIAGNOSIS — R252 Cramp and spasm: Secondary | ICD-10-CM | POA: Diagnosis not present

## 2015-06-27 DIAGNOSIS — F331 Major depressive disorder, recurrent, moderate: Secondary | ICD-10-CM | POA: Diagnosis not present

## 2015-06-27 DIAGNOSIS — I10 Essential (primary) hypertension: Secondary | ICD-10-CM | POA: Diagnosis not present

## 2015-06-27 DIAGNOSIS — N390 Urinary tract infection, site not specified: Secondary | ICD-10-CM | POA: Diagnosis not present

## 2015-06-27 DIAGNOSIS — G8111 Spastic hemiplegia affecting right dominant side: Secondary | ICD-10-CM | POA: Diagnosis not present

## 2015-06-27 DIAGNOSIS — E039 Hypothyroidism, unspecified: Secondary | ICD-10-CM | POA: Diagnosis not present

## 2015-06-27 DIAGNOSIS — N319 Neuromuscular dysfunction of bladder, unspecified: Secondary | ICD-10-CM | POA: Diagnosis not present

## 2015-06-27 DIAGNOSIS — G039 Meningitis, unspecified: Secondary | ICD-10-CM | POA: Diagnosis not present

## 2015-06-27 DIAGNOSIS — G43109 Migraine with aura, not intractable, without status migrainosus: Secondary | ICD-10-CM | POA: Diagnosis not present

## 2015-06-27 DIAGNOSIS — M549 Dorsalgia, unspecified: Secondary | ICD-10-CM | POA: Diagnosis not present

## 2015-06-27 DIAGNOSIS — F900 Attention-deficit hyperactivity disorder, predominantly inattentive type: Secondary | ICD-10-CM | POA: Diagnosis not present

## 2015-06-27 DIAGNOSIS — Z87828 Personal history of other (healed) physical injury and trauma: Secondary | ICD-10-CM | POA: Diagnosis not present

## 2015-06-27 DIAGNOSIS — E559 Vitamin D deficiency, unspecified: Secondary | ICD-10-CM | POA: Diagnosis not present

## 2015-06-28 DIAGNOSIS — R269 Unspecified abnormalities of gait and mobility: Secondary | ICD-10-CM | POA: Diagnosis not present

## 2015-06-28 DIAGNOSIS — G8222 Paraplegia, incomplete: Secondary | ICD-10-CM | POA: Diagnosis not present

## 2015-06-28 DIAGNOSIS — R252 Cramp and spasm: Secondary | ICD-10-CM | POA: Diagnosis not present

## 2015-07-20 DIAGNOSIS — G43109 Migraine with aura, not intractable, without status migrainosus: Secondary | ICD-10-CM | POA: Diagnosis not present

## 2015-07-20 DIAGNOSIS — Z87828 Personal history of other (healed) physical injury and trauma: Secondary | ICD-10-CM | POA: Diagnosis not present

## 2015-07-20 DIAGNOSIS — F331 Major depressive disorder, recurrent, moderate: Secondary | ICD-10-CM | POA: Diagnosis not present

## 2015-07-20 DIAGNOSIS — K5900 Constipation, unspecified: Secondary | ICD-10-CM | POA: Diagnosis not present

## 2015-07-20 DIAGNOSIS — E039 Hypothyroidism, unspecified: Secondary | ICD-10-CM | POA: Diagnosis not present

## 2015-07-20 DIAGNOSIS — F419 Anxiety disorder, unspecified: Secondary | ICD-10-CM | POA: Diagnosis not present

## 2015-07-20 DIAGNOSIS — E559 Vitamin D deficiency, unspecified: Secondary | ICD-10-CM | POA: Diagnosis not present

## 2015-07-20 DIAGNOSIS — F900 Attention-deficit hyperactivity disorder, predominantly inattentive type: Secondary | ICD-10-CM | POA: Diagnosis not present

## 2015-07-20 DIAGNOSIS — G4701 Insomnia due to medical condition: Secondary | ICD-10-CM | POA: Diagnosis not present

## 2015-07-20 DIAGNOSIS — I10 Essential (primary) hypertension: Secondary | ICD-10-CM | POA: Diagnosis not present

## 2015-07-20 DIAGNOSIS — G039 Meningitis, unspecified: Secondary | ICD-10-CM | POA: Diagnosis not present

## 2015-07-20 DIAGNOSIS — K219 Gastro-esophageal reflux disease without esophagitis: Secondary | ICD-10-CM | POA: Diagnosis not present

## 2015-07-20 DIAGNOSIS — G8111 Spastic hemiplegia affecting right dominant side: Secondary | ICD-10-CM | POA: Diagnosis not present

## 2015-07-25 DIAGNOSIS — G8929 Other chronic pain: Secondary | ICD-10-CM | POA: Diagnosis not present

## 2015-07-25 DIAGNOSIS — N390 Urinary tract infection, site not specified: Secondary | ICD-10-CM | POA: Diagnosis not present

## 2015-08-05 DIAGNOSIS — R252 Cramp and spasm: Secondary | ICD-10-CM | POA: Diagnosis not present

## 2015-08-05 DIAGNOSIS — G8222 Paraplegia, incomplete: Secondary | ICD-10-CM | POA: Diagnosis not present

## 2015-08-22 DIAGNOSIS — N390 Urinary tract infection, site not specified: Secondary | ICD-10-CM | POA: Diagnosis not present

## 2015-08-22 DIAGNOSIS — G8929 Other chronic pain: Secondary | ICD-10-CM | POA: Diagnosis not present

## 2015-09-19 ENCOUNTER — Other Ambulatory Visit: Payer: Self-pay | Admitting: Family Medicine

## 2015-09-19 DIAGNOSIS — Z7952 Long term (current) use of systemic steroids: Secondary | ICD-10-CM | POA: Diagnosis not present

## 2015-09-19 DIAGNOSIS — N841 Polyp of cervix uteri: Secondary | ICD-10-CM | POA: Diagnosis not present

## 2015-09-19 DIAGNOSIS — I10 Essential (primary) hypertension: Secondary | ICD-10-CM | POA: Diagnosis not present

## 2015-09-19 DIAGNOSIS — G8929 Other chronic pain: Secondary | ICD-10-CM | POA: Diagnosis not present

## 2015-09-19 DIAGNOSIS — Z0001 Encounter for general adult medical examination with abnormal findings: Secondary | ICD-10-CM | POA: Diagnosis not present

## 2015-09-19 DIAGNOSIS — E039 Hypothyroidism, unspecified: Secondary | ICD-10-CM | POA: Diagnosis not present

## 2015-09-19 DIAGNOSIS — R300 Dysuria: Secondary | ICD-10-CM | POA: Diagnosis not present

## 2015-09-19 DIAGNOSIS — F411 Generalized anxiety disorder: Secondary | ICD-10-CM | POA: Diagnosis not present

## 2015-09-19 DIAGNOSIS — Z124 Encounter for screening for malignant neoplasm of cervix: Secondary | ICD-10-CM | POA: Diagnosis not present

## 2015-09-19 DIAGNOSIS — G8111 Spastic hemiplegia affecting right dominant side: Secondary | ICD-10-CM | POA: Diagnosis not present

## 2015-09-19 DIAGNOSIS — Z87828 Personal history of other (healed) physical injury and trauma: Secondary | ICD-10-CM | POA: Diagnosis not present

## 2015-09-19 DIAGNOSIS — F900 Attention-deficit hyperactivity disorder, predominantly inattentive type: Secondary | ICD-10-CM | POA: Diagnosis not present

## 2015-09-19 DIAGNOSIS — N898 Other specified noninflammatory disorders of vagina: Secondary | ICD-10-CM | POA: Diagnosis not present

## 2015-09-19 DIAGNOSIS — E559 Vitamin D deficiency, unspecified: Secondary | ICD-10-CM | POA: Diagnosis not present

## 2015-10-06 DIAGNOSIS — Z79891 Long term (current) use of opiate analgesic: Secondary | ICD-10-CM | POA: Diagnosis not present

## 2015-12-16 DIAGNOSIS — Z1231 Encounter for screening mammogram for malignant neoplasm of breast: Secondary | ICD-10-CM | POA: Diagnosis not present

## 2016-01-13 DIAGNOSIS — G8929 Other chronic pain: Secondary | ICD-10-CM | POA: Diagnosis not present

## 2016-01-13 DIAGNOSIS — G8222 Paraplegia, incomplete: Secondary | ICD-10-CM | POA: Diagnosis not present

## 2016-01-13 DIAGNOSIS — R269 Unspecified abnormalities of gait and mobility: Secondary | ICD-10-CM | POA: Diagnosis not present

## 2016-01-13 DIAGNOSIS — R252 Cramp and spasm: Secondary | ICD-10-CM | POA: Diagnosis not present

## 2016-01-13 DIAGNOSIS — M545 Low back pain: Secondary | ICD-10-CM | POA: Diagnosis not present

## 2016-01-20 DIAGNOSIS — M7989 Other specified soft tissue disorders: Secondary | ICD-10-CM | POA: Diagnosis not present

## 2016-01-20 DIAGNOSIS — M79672 Pain in left foot: Secondary | ICD-10-CM | POA: Diagnosis not present

## 2016-05-01 DIAGNOSIS — G8222 Paraplegia, incomplete: Secondary | ICD-10-CM | POA: Diagnosis not present

## 2016-06-14 DIAGNOSIS — G8929 Other chronic pain: Secondary | ICD-10-CM | POA: Diagnosis not present

## 2016-06-14 DIAGNOSIS — S24109S Unspecified injury at unspecified level of thoracic spinal cord, sequela: Secondary | ICD-10-CM | POA: Diagnosis not present

## 2016-06-14 DIAGNOSIS — M5442 Lumbago with sciatica, left side: Secondary | ICD-10-CM | POA: Diagnosis not present

## 2016-06-14 DIAGNOSIS — F0630 Mood disorder due to known physiological condition, unspecified: Secondary | ICD-10-CM | POA: Diagnosis not present

## 2016-06-14 DIAGNOSIS — Z79891 Long term (current) use of opiate analgesic: Secondary | ICD-10-CM | POA: Diagnosis not present

## 2016-06-25 DIAGNOSIS — M436 Torticollis: Secondary | ICD-10-CM | POA: Diagnosis not present

## 2016-06-25 DIAGNOSIS — M542 Cervicalgia: Secondary | ICD-10-CM | POA: Diagnosis not present

## 2016-06-28 DIAGNOSIS — M542 Cervicalgia: Secondary | ICD-10-CM | POA: Diagnosis not present

## 2016-07-11 DIAGNOSIS — I10 Essential (primary) hypertension: Secondary | ICD-10-CM | POA: Diagnosis not present

## 2016-07-11 DIAGNOSIS — R51 Headache: Secondary | ICD-10-CM | POA: Diagnosis not present

## 2016-07-11 DIAGNOSIS — G8111 Spastic hemiplegia affecting right dominant side: Secondary | ICD-10-CM | POA: Diagnosis not present

## 2016-07-11 DIAGNOSIS — Z87828 Personal history of other (healed) physical injury and trauma: Secondary | ICD-10-CM | POA: Diagnosis not present

## 2016-08-15 DIAGNOSIS — I10 Essential (primary) hypertension: Secondary | ICD-10-CM | POA: Diagnosis not present

## 2016-08-15 DIAGNOSIS — G8111 Spastic hemiplegia affecting right dominant side: Secondary | ICD-10-CM | POA: Diagnosis not present

## 2016-09-05 DIAGNOSIS — R350 Frequency of micturition: Secondary | ICD-10-CM | POA: Diagnosis not present

## 2016-09-05 DIAGNOSIS — G8929 Other chronic pain: Secondary | ICD-10-CM | POA: Diagnosis not present

## 2016-09-05 DIAGNOSIS — F900 Attention-deficit hyperactivity disorder, predominantly inattentive type: Secondary | ICD-10-CM | POA: Diagnosis not present

## 2016-09-05 DIAGNOSIS — Z87828 Personal history of other (healed) physical injury and trauma: Secondary | ICD-10-CM | POA: Diagnosis not present

## 2016-09-05 DIAGNOSIS — F419 Anxiety disorder, unspecified: Secondary | ICD-10-CM | POA: Diagnosis not present

## 2016-09-05 DIAGNOSIS — N319 Neuromuscular dysfunction of bladder, unspecified: Secondary | ICD-10-CM | POA: Diagnosis not present

## 2016-09-05 DIAGNOSIS — K219 Gastro-esophageal reflux disease without esophagitis: Secondary | ICD-10-CM | POA: Diagnosis not present

## 2016-09-05 DIAGNOSIS — G8111 Spastic hemiplegia affecting right dominant side: Secondary | ICD-10-CM | POA: Diagnosis not present

## 2016-10-03 DIAGNOSIS — I10 Essential (primary) hypertension: Secondary | ICD-10-CM | POA: Diagnosis not present

## 2016-10-03 DIAGNOSIS — G8111 Spastic hemiplegia affecting right dominant side: Secondary | ICD-10-CM | POA: Diagnosis not present

## 2016-10-31 DIAGNOSIS — N3289 Other specified disorders of bladder: Secondary | ICD-10-CM | POA: Diagnosis not present

## 2016-10-31 DIAGNOSIS — G8111 Spastic hemiplegia affecting right dominant side: Secondary | ICD-10-CM | POA: Diagnosis not present

## 2016-10-31 DIAGNOSIS — H9201 Otalgia, right ear: Secondary | ICD-10-CM | POA: Diagnosis not present

## 2016-10-31 DIAGNOSIS — I10 Essential (primary) hypertension: Secondary | ICD-10-CM | POA: Diagnosis not present

## 2016-10-31 DIAGNOSIS — R58 Hemorrhage, not elsewhere classified: Secondary | ICD-10-CM | POA: Diagnosis not present

## 2016-11-28 DIAGNOSIS — N319 Neuromuscular dysfunction of bladder, unspecified: Secondary | ICD-10-CM | POA: Diagnosis not present

## 2016-11-28 DIAGNOSIS — F900 Attention-deficit hyperactivity disorder, predominantly inattentive type: Secondary | ICD-10-CM | POA: Diagnosis not present

## 2016-11-28 DIAGNOSIS — F331 Major depressive disorder, recurrent, moderate: Secondary | ICD-10-CM | POA: Diagnosis not present

## 2016-11-28 DIAGNOSIS — F419 Anxiety disorder, unspecified: Secondary | ICD-10-CM | POA: Diagnosis not present

## 2016-11-28 DIAGNOSIS — J309 Allergic rhinitis, unspecified: Secondary | ICD-10-CM | POA: Diagnosis not present

## 2016-11-28 DIAGNOSIS — E039 Hypothyroidism, unspecified: Secondary | ICD-10-CM | POA: Diagnosis not present

## 2016-11-28 DIAGNOSIS — K219 Gastro-esophageal reflux disease without esophagitis: Secondary | ICD-10-CM | POA: Diagnosis not present

## 2016-11-28 DIAGNOSIS — Z87828 Personal history of other (healed) physical injury and trauma: Secondary | ICD-10-CM | POA: Diagnosis not present

## 2016-11-28 DIAGNOSIS — G43109 Migraine with aura, not intractable, without status migrainosus: Secondary | ICD-10-CM | POA: Diagnosis not present

## 2016-11-28 DIAGNOSIS — G8111 Spastic hemiplegia affecting right dominant side: Secondary | ICD-10-CM | POA: Diagnosis not present

## 2016-11-28 DIAGNOSIS — I10 Essential (primary) hypertension: Secondary | ICD-10-CM | POA: Diagnosis not present

## 2016-11-28 DIAGNOSIS — E559 Vitamin D deficiency, unspecified: Secondary | ICD-10-CM | POA: Diagnosis not present

## 2016-12-05 DIAGNOSIS — G8929 Other chronic pain: Secondary | ICD-10-CM | POA: Diagnosis not present

## 2016-12-05 DIAGNOSIS — Z79891 Long term (current) use of opiate analgesic: Secondary | ICD-10-CM | POA: Diagnosis not present

## 2016-12-05 DIAGNOSIS — M5442 Lumbago with sciatica, left side: Secondary | ICD-10-CM | POA: Diagnosis not present

## 2016-12-05 DIAGNOSIS — M5137 Other intervertebral disc degeneration, lumbosacral region: Secondary | ICD-10-CM | POA: Diagnosis not present

## 2017-03-08 DIAGNOSIS — F0630 Mood disorder due to known physiological condition, unspecified: Secondary | ICD-10-CM | POA: Diagnosis not present

## 2017-03-08 DIAGNOSIS — M5137 Other intervertebral disc degeneration, lumbosacral region: Secondary | ICD-10-CM | POA: Diagnosis not present

## 2017-03-08 DIAGNOSIS — M961 Postlaminectomy syndrome, not elsewhere classified: Secondary | ICD-10-CM | POA: Diagnosis not present

## 2017-05-15 DIAGNOSIS — N3941 Urge incontinence: Secondary | ICD-10-CM | POA: Diagnosis not present

## 2017-05-15 DIAGNOSIS — N319 Neuromuscular dysfunction of bladder, unspecified: Secondary | ICD-10-CM | POA: Diagnosis not present

## 2017-06-04 DIAGNOSIS — M5442 Lumbago with sciatica, left side: Secondary | ICD-10-CM | POA: Diagnosis not present

## 2017-06-04 DIAGNOSIS — M961 Postlaminectomy syndrome, not elsewhere classified: Secondary | ICD-10-CM | POA: Diagnosis not present

## 2017-06-04 DIAGNOSIS — R635 Abnormal weight gain: Secondary | ICD-10-CM | POA: Diagnosis not present

## 2017-06-04 DIAGNOSIS — Z79891 Long term (current) use of opiate analgesic: Secondary | ICD-10-CM | POA: Diagnosis not present

## 2017-06-04 DIAGNOSIS — G8929 Other chronic pain: Secondary | ICD-10-CM | POA: Diagnosis not present

## 2017-06-04 DIAGNOSIS — M5137 Other intervertebral disc degeneration, lumbosacral region: Secondary | ICD-10-CM | POA: Diagnosis not present

## 2017-06-19 DIAGNOSIS — G8111 Spastic hemiplegia affecting right dominant side: Secondary | ICD-10-CM | POA: Diagnosis not present

## 2017-06-19 DIAGNOSIS — Z87828 Personal history of other (healed) physical injury and trauma: Secondary | ICD-10-CM | POA: Diagnosis not present

## 2017-06-19 DIAGNOSIS — G8929 Other chronic pain: Secondary | ICD-10-CM | POA: Diagnosis not present

## 2017-07-24 DIAGNOSIS — R8290 Unspecified abnormal findings in urine: Secondary | ICD-10-CM | POA: Diagnosis not present

## 2017-07-24 DIAGNOSIS — Z87828 Personal history of other (healed) physical injury and trauma: Secondary | ICD-10-CM | POA: Diagnosis not present

## 2017-07-24 DIAGNOSIS — G8111 Spastic hemiplegia affecting right dominant side: Secondary | ICD-10-CM | POA: Diagnosis not present

## 2017-07-24 DIAGNOSIS — G8929 Other chronic pain: Secondary | ICD-10-CM | POA: Diagnosis not present

## 2017-08-06 DIAGNOSIS — F331 Major depressive disorder, recurrent, moderate: Secondary | ICD-10-CM | POA: Diagnosis not present

## 2017-08-15 DIAGNOSIS — F900 Attention-deficit hyperactivity disorder, predominantly inattentive type: Secondary | ICD-10-CM | POA: Diagnosis not present

## 2017-08-15 DIAGNOSIS — F419 Anxiety disorder, unspecified: Secondary | ICD-10-CM | POA: Diagnosis not present

## 2017-08-15 DIAGNOSIS — G8111 Spastic hemiplegia affecting right dominant side: Secondary | ICD-10-CM | POA: Diagnosis not present

## 2017-08-15 DIAGNOSIS — G8929 Other chronic pain: Secondary | ICD-10-CM | POA: Diagnosis not present

## 2017-09-11 DIAGNOSIS — R8290 Unspecified abnormal findings in urine: Secondary | ICD-10-CM | POA: Diagnosis not present

## 2017-09-11 DIAGNOSIS — R350 Frequency of micturition: Secondary | ICD-10-CM | POA: Diagnosis not present

## 2017-09-11 DIAGNOSIS — G8111 Spastic hemiplegia affecting right dominant side: Secondary | ICD-10-CM | POA: Diagnosis not present

## 2017-09-11 DIAGNOSIS — G8929 Other chronic pain: Secondary | ICD-10-CM | POA: Diagnosis not present

## 2017-09-11 DIAGNOSIS — K5909 Other constipation: Secondary | ICD-10-CM | POA: Diagnosis not present

## 2017-09-12 DIAGNOSIS — F331 Major depressive disorder, recurrent, moderate: Secondary | ICD-10-CM | POA: Diagnosis not present

## 2017-09-12 DIAGNOSIS — M961 Postlaminectomy syndrome, not elsewhere classified: Secondary | ICD-10-CM | POA: Diagnosis not present

## 2017-09-12 DIAGNOSIS — M5137 Other intervertebral disc degeneration, lumbosacral region: Secondary | ICD-10-CM | POA: Diagnosis not present

## 2017-10-09 DIAGNOSIS — G8111 Spastic hemiplegia affecting right dominant side: Secondary | ICD-10-CM | POA: Diagnosis not present

## 2017-10-09 DIAGNOSIS — G8929 Other chronic pain: Secondary | ICD-10-CM | POA: Diagnosis not present

## 2017-10-23 DIAGNOSIS — M961 Postlaminectomy syndrome, not elsewhere classified: Secondary | ICD-10-CM | POA: Diagnosis not present

## 2017-10-23 DIAGNOSIS — Z79891 Long term (current) use of opiate analgesic: Secondary | ICD-10-CM | POA: Diagnosis not present

## 2017-10-23 DIAGNOSIS — M5137 Other intervertebral disc degeneration, lumbosacral region: Secondary | ICD-10-CM | POA: Diagnosis not present

## 2017-10-23 DIAGNOSIS — F331 Major depressive disorder, recurrent, moderate: Secondary | ICD-10-CM | POA: Diagnosis not present

## 2017-11-06 DIAGNOSIS — F419 Anxiety disorder, unspecified: Secondary | ICD-10-CM | POA: Diagnosis not present

## 2017-11-06 DIAGNOSIS — F331 Major depressive disorder, recurrent, moderate: Secondary | ICD-10-CM | POA: Diagnosis not present

## 2017-11-06 DIAGNOSIS — G8929 Other chronic pain: Secondary | ICD-10-CM | POA: Diagnosis not present

## 2017-11-06 DIAGNOSIS — G8111 Spastic hemiplegia affecting right dominant side: Secondary | ICD-10-CM | POA: Diagnosis not present

## 2017-11-06 DIAGNOSIS — I10 Essential (primary) hypertension: Secondary | ICD-10-CM | POA: Diagnosis not present

## 2017-11-06 DIAGNOSIS — F900 Attention-deficit hyperactivity disorder, predominantly inattentive type: Secondary | ICD-10-CM | POA: Diagnosis not present

## 2017-11-06 DIAGNOSIS — K219 Gastro-esophageal reflux disease without esophagitis: Secondary | ICD-10-CM | POA: Diagnosis not present

## 2017-11-06 DIAGNOSIS — N319 Neuromuscular dysfunction of bladder, unspecified: Secondary | ICD-10-CM | POA: Diagnosis not present

## 2017-11-06 DIAGNOSIS — E039 Hypothyroidism, unspecified: Secondary | ICD-10-CM | POA: Diagnosis not present

## 2017-11-27 DIAGNOSIS — G8929 Other chronic pain: Secondary | ICD-10-CM | POA: Diagnosis not present

## 2017-11-29 DIAGNOSIS — M961 Postlaminectomy syndrome, not elsewhere classified: Secondary | ICD-10-CM | POA: Diagnosis not present

## 2017-11-29 DIAGNOSIS — M5137 Other intervertebral disc degeneration, lumbosacral region: Secondary | ICD-10-CM | POA: Diagnosis not present

## 2017-11-29 DIAGNOSIS — F331 Major depressive disorder, recurrent, moderate: Secondary | ICD-10-CM | POA: Diagnosis not present

## 2017-12-25 DIAGNOSIS — R3915 Urgency of urination: Secondary | ICD-10-CM | POA: Diagnosis not present

## 2017-12-25 DIAGNOSIS — N390 Urinary tract infection, site not specified: Secondary | ICD-10-CM | POA: Diagnosis not present

## 2017-12-25 DIAGNOSIS — G8929 Other chronic pain: Secondary | ICD-10-CM | POA: Diagnosis not present

## 2018-01-29 DIAGNOSIS — G8929 Other chronic pain: Secondary | ICD-10-CM | POA: Diagnosis not present

## 2018-01-29 DIAGNOSIS — G8111 Spastic hemiplegia affecting right dominant side: Secondary | ICD-10-CM | POA: Diagnosis not present

## 2018-01-29 DIAGNOSIS — R229 Localized swelling, mass and lump, unspecified: Secondary | ICD-10-CM | POA: Diagnosis not present

## 2018-01-29 DIAGNOSIS — I10 Essential (primary) hypertension: Secondary | ICD-10-CM | POA: Diagnosis not present

## 2018-01-29 DIAGNOSIS — F900 Attention-deficit hyperactivity disorder, predominantly inattentive type: Secondary | ICD-10-CM | POA: Diagnosis not present

## 2018-01-29 DIAGNOSIS — N319 Neuromuscular dysfunction of bladder, unspecified: Secondary | ICD-10-CM | POA: Diagnosis not present

## 2018-01-29 DIAGNOSIS — M791 Myalgia, unspecified site: Secondary | ICD-10-CM | POA: Diagnosis not present

## 2018-01-29 DIAGNOSIS — F419 Anxiety disorder, unspecified: Secondary | ICD-10-CM | POA: Diagnosis not present

## 2018-02-19 DIAGNOSIS — G8929 Other chronic pain: Secondary | ICD-10-CM | POA: Diagnosis not present

## 2018-02-19 DIAGNOSIS — M5442 Lumbago with sciatica, left side: Secondary | ICD-10-CM | POA: Diagnosis not present

## 2018-02-19 DIAGNOSIS — M961 Postlaminectomy syndrome, not elsewhere classified: Secondary | ICD-10-CM | POA: Diagnosis not present

## 2018-02-19 DIAGNOSIS — G039 Meningitis, unspecified: Secondary | ICD-10-CM | POA: Diagnosis not present

## 2018-02-19 DIAGNOSIS — E039 Hypothyroidism, unspecified: Secondary | ICD-10-CM | POA: Diagnosis not present

## 2018-02-19 DIAGNOSIS — F331 Major depressive disorder, recurrent, moderate: Secondary | ICD-10-CM | POA: Diagnosis not present

## 2018-02-26 DIAGNOSIS — E039 Hypothyroidism, unspecified: Secondary | ICD-10-CM | POA: Diagnosis not present

## 2018-02-26 DIAGNOSIS — I10 Essential (primary) hypertension: Secondary | ICD-10-CM | POA: Diagnosis not present

## 2018-02-26 DIAGNOSIS — M791 Myalgia, unspecified site: Secondary | ICD-10-CM | POA: Diagnosis not present

## 2018-02-26 DIAGNOSIS — G8929 Other chronic pain: Secondary | ICD-10-CM | POA: Diagnosis not present

## 2018-02-26 DIAGNOSIS — N390 Urinary tract infection, site not specified: Secondary | ICD-10-CM | POA: Diagnosis not present

## 2018-02-26 DIAGNOSIS — Z79899 Other long term (current) drug therapy: Secondary | ICD-10-CM | POA: Diagnosis not present

## 2018-03-26 DIAGNOSIS — G8929 Other chronic pain: Secondary | ICD-10-CM | POA: Diagnosis not present

## 2018-03-26 DIAGNOSIS — N898 Other specified noninflammatory disorders of vagina: Secondary | ICD-10-CM | POA: Diagnosis not present

## 2018-03-26 DIAGNOSIS — Z23 Encounter for immunization: Secondary | ICD-10-CM | POA: Diagnosis not present

## 2018-04-11 DIAGNOSIS — N319 Neuromuscular dysfunction of bladder, unspecified: Secondary | ICD-10-CM | POA: Diagnosis not present

## 2018-04-18 ENCOUNTER — Other Ambulatory Visit: Payer: Self-pay | Admitting: Urology

## 2018-04-22 NOTE — Progress Notes (Signed)
Spoke with dr foster anesthesia and urine pregnancy not needed for 04-29-18 surgery

## 2018-04-23 ENCOUNTER — Encounter (HOSPITAL_BASED_OUTPATIENT_CLINIC_OR_DEPARTMENT_OTHER): Payer: Self-pay | Admitting: *Deleted

## 2018-04-23 ENCOUNTER — Other Ambulatory Visit: Payer: Self-pay

## 2018-04-23 DIAGNOSIS — N390 Urinary tract infection, site not specified: Secondary | ICD-10-CM | POA: Diagnosis not present

## 2018-04-23 DIAGNOSIS — G8929 Other chronic pain: Secondary | ICD-10-CM | POA: Diagnosis not present

## 2018-04-23 NOTE — Progress Notes (Signed)
Spoke w/ pt via phone for pre-op interview.  Npo after mn.  Arrive at 0530.  Needs istat 8 and ekg.  Will take nucynta, ms contin, baclofen, protonix, oxybutynin, and synthroid am dos w/ sips of water.  Pt although is paraplegic , does walk with walker and for long distance uses wheelchair.

## 2018-04-25 NOTE — H&P (Signed)
The patient clinically is not infected on daily Keflex. Urine culture about 1 week ago was negative. Incontinence is stable   She underwent Botox with the usual template though because of her spasticity I could not inject the lateral walls but I injected higher recognizing she is on clean intermittent catheterization. She tolerated the procedure beautifully. There was no cystitis. Trigone was identified and not injected. We will call her tomorrow. this is her 1st treatment of 200 units under local and she did great. She will call us in 2 weeks to tell us the results      ALLERGIES: CeleBREX CAPS Codeine Derivatives Erythromycin Macrobid CAPS Sulfa Drugs Topamax Wellbutrin    MEDICATIONS: Keflex 250 mg capsule 1 capsule PO Daily  Adderall XR CP24 Oral  Advil TABS Oral  Baclofen 20 mg tablet Oral  Cephalexin 500 mg capsule 0 Oral  Cymbalta 60 MG Oral Capsule Delayed Release Particles Oral  Depakote TBEC Oral  Dexilant 60 mg capsule, delayed release, biphasic Oral  Diazepam 5 mg tablet Oral  Ibuprofen 800 MG Oral Tablet Oral  Maxalt TABS Oral  Methenamine Hippurate 1 gram tablet 0 Oral  Monurol 3 gram packet 1 Oral Once  Ms Contin 15 mg tablet, extended release Oral  Msir 15 mg tablet Oral  Myrbetriq 50 mg tablet, extended release 24 hr 1 Oral  Oxybutynin Chloride Er 15 mg tablet, extended release 24 hr 0 Oral  Phenergan TABS Oral  Relpax 40 mg tablet Oral  Sonata CAPS Oral  Sumatriptan Succinate 6 mg/0.5 ml cartridge Subcutaneous  Trimethoprim 100 mg tablet 1 Oral  Valium 5 MG Oral Tablet Oral  Xanax 0.5 MG Oral Tablet Oral  Zanaflex CAPS Oral  Zofran TABS Oral     GU PSH: Cystoscopy Hydrodistention - 2009 Urethrolysis - 2015, 2012, 2011, 2010     [facility] Notes: Urologic Surgery, Urologic Surgery, Urologic Surgery, Urologic Surgery, Cystoscopy With Dilation Of Bladder   NON-GU PSH: No Non-GU PSH     GU PMH: Bladder, Neuromuscular dysfunction, Unspec, Neurogenic bladder -  2016 Chronic cystitis (w/o hematuria), Chronic cystitis - 2016 Urge incontinence, Urge incontinence of urine - 2015 Abdominal Pain Unspec, Abdominal pain - 2014 Dorsalgia, Unspec, Backache - 2014 Gross hematuria, Gross hematuria - 2014 Urinary Frequency, Increased urinary frequency - 2014 Urinary Retention, Unspec, Incomplete bladder emptying - 2014 Urinary Tract Inf, Unspec site, Urinary tract infection - 2014     PMH Notes:  2006-05-28 11:17:06 - Note: Anxiety  2006-05-28 11:17:06 - Note: Recent Methicillin-resistant Staphylococcus Aureus Infection   NON-GU PMH: Encounter for general adult medical examination without abnormal findings, Encounter for preventive health examination - 2016 Personal history of other (healed) physical injury and trauma, History of spinal cord injury - 2014 Personal history of other mental and behavioral disorders, History of depression - 2014 Seizure disorder, Convulsions (As Sx) - 2014    FAMILY HISTORY: Acute Myocardial Infarction - Father, Grandmother Brain tumor - Grandfather, Uncle Breast Cancer - Mother Family Health Status Number - Runs In Family Urologic Disorder - Father   SOCIAL HISTORY: Marital Status: Married    Notes: Never A Smoker, Occupation:, Alcohol Use, Tobacco Use, Marital History - Currently Married, Caffeine Use   REVIEW OF SYSTEMS:    GU Review Female:  Patient denies frequent urination, hard to postpone urination, burning /pain with urination, get up at night to urinate, leakage of urine, stream starts and stops, trouble starting your stream, have to strain to urinate, and being pregnant.  Gastrointestinal (Upper):  Patient denies nausea, vomiting, and indigestion/ heartburn.  Gastrointestinal (Lower):  Patient denies diarrhea and constipation.  Constitutional:  Patient denies fever, weight loss, night sweats, and fatigue.  Skin:  Patient denies skin rash/ lesion and itching.  Eyes:  Patient denies blurred vision and double  vision.  Ears/ Nose/ Throat:  Patient denies sore throat and sinus problems.  Hematologic/Lymphatic:  Patient denies swollen glands and easy bruising.  Cardiovascular:  Patient denies leg swelling and chest pains.  Respiratory:  Patient denies cough and shortness of breath.  Endocrine:  Patient denies excessive thirst.  Musculoskeletal:  Patient denies back pain and joint pain.  Neurological:  Patient denies headaches and dizziness.  Psychologic:  Patient denies depression and anxiety.   VITAL SIGNS:     05/15/2017 07:29 AM  BP 135/69 mmHg  Heart Rate 100 /min   PAST DATA REVIEWED:   Source Of History:  Patient   PROCEDURES:    Botox 100mg  - 50932, [id number]  Preservice: Risks, benefits, and some of the potential complications of the procedure were discussed at length with the patient including infection, bleeding, voiding discomfort, urinary retention, fever, chills, sepsis, Black Box warning, and others. All questions were answered. Informed consent was obtained. Antibiotic prophylaxis was given. Sterile technique and urothelial analgesia were used.  Meatus:  Normal size. Normal location. Normal condition.  Urethra:  No hypermobility. No leakage.  Ureteral Orifices:  Normal location. Normal size. Normal shape. Effluxed clear urine.  Bladder:  No trabeculation. No tumors. Normal mucosa. No stones.      100 units of Botox was mixed with 10 cc of NaCl using sterile technique. I injected the Botox at 5 and 7 o'clock and cephalid to the interureteric ridge into the lower 1/3 of the bladder using the Laborie needle and cystoscopy. 20 injections sites were utilized. There was little to no bleeding. The bladder was left comfortably full and voiding trial was performed. Post procedure instructions were given and patient will be followed as per protocol. The procedure was well tolerated.    Urinalysis  Dipstick Dipstick Cont'd  Color: Yellow Bilirubin: Neg  Appearance: Clear Ketones: Neg   Specific Gravity: 1.020 Blood: Neg  pH: 7.0 Protein: Neg  Glucose: Neg Urobilinogen: 0.2   Nitrites: Neg   Leukocyte Esterase: Neg        ASSESSMENT:  After a thorough review of the management options for the patient's condition the patient  elected to proceed with surgical therapy as noted above. We have discussed the potential benefits and risks of the procedure, side effects of the proposed treatment, the likelihood of the patient achieving the goals of the procedure, and any potential problems that might occur during the procedure or recuperation. Informed consent has been obtained.

## 2018-04-29 ENCOUNTER — Ambulatory Visit (HOSPITAL_BASED_OUTPATIENT_CLINIC_OR_DEPARTMENT_OTHER): Admission: RE | Admit: 2018-04-29 | Payer: 59 | Source: Ambulatory Visit | Admitting: Urology

## 2018-04-29 HISTORY — DX: Essential (primary) hypertension: I10

## 2018-04-29 HISTORY — DX: Iron deficiency anemia, unspecified: D50.9

## 2018-04-29 SURGERY — CYSTOSCOPY, WITH BIOPSY
Anesthesia: General

## 2018-05-07 NOTE — Progress Notes (Signed)
SPOKE WITH Melissa Mills NO CHANGES FROM MEDICAL HISTORY DONE 04-23-18 NPO AFTER MIDNIGHT ARRIVE 815 AM 05-09-18 MEDICATIONS TO TAKE : NUCYNTA, MS CONTIN, BACLOFEN, PROTONIX, OXYBUTYNIN, AND SYNTHROID SIPS OF WATER IN AM. 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 NEEDS I STAT 8 AND EKG. HAS SURGERY ORDERS IN EPIC

## 2018-05-08 ENCOUNTER — Other Ambulatory Visit: Payer: Self-pay | Admitting: Urology

## 2018-05-08 NOTE — Pre-Procedure Instructions (Signed)
Left message with Pam that Pre op at [facility] is unable to see orders in epic anymore and requested that the order be placed in epic again.

## 2018-05-09 ENCOUNTER — Encounter (HOSPITAL_BASED_OUTPATIENT_CLINIC_OR_DEPARTMENT_OTHER)
Admission: RE | Disposition: A | Discharge status: Home / Self Care | Payer: Self-pay | Source: Ambulatory Visit | Attending: Urology

## 2018-05-09 ENCOUNTER — Encounter (HOSPITAL_BASED_OUTPATIENT_CLINIC_OR_DEPARTMENT_OTHER): Payer: Self-pay | Admitting: *Deleted

## 2018-05-09 ENCOUNTER — Ambulatory Visit (HOSPITAL_BASED_OUTPATIENT_CLINIC_OR_DEPARTMENT_OTHER): Payer: 59 | Admitting: Certified Registered"

## 2018-05-09 ENCOUNTER — Ambulatory Visit (HOSPITAL_BASED_OUTPATIENT_CLINIC_OR_DEPARTMENT_OTHER)
Admission: RE | Admit: 2018-05-09 | Discharge: 2018-05-09 | Disposition: A | Discharge status: Home / Self Care | Payer: 59 | Source: Ambulatory Visit | Attending: Urology | Admitting: Urology

## 2018-05-09 DIAGNOSIS — N319 Neuromuscular dysfunction of bladder, unspecified: Secondary | ICD-10-CM | POA: Diagnosis not present

## 2018-05-09 DIAGNOSIS — Z79891 Long term (current) use of opiate analgesic: Secondary | ICD-10-CM | POA: Insufficient documentation

## 2018-05-09 DIAGNOSIS — G40909 Epilepsy, unspecified, not intractable, without status epilepticus: Secondary | ICD-10-CM | POA: Insufficient documentation

## 2018-05-09 DIAGNOSIS — Z792 Long term (current) use of antibiotics: Secondary | ICD-10-CM | POA: Diagnosis not present

## 2018-05-09 DIAGNOSIS — Z87440 Personal history of urinary (tract) infections: Secondary | ICD-10-CM | POA: Insufficient documentation

## 2018-05-09 DIAGNOSIS — F329 Major depressive disorder, single episode, unspecified: Secondary | ICD-10-CM | POA: Insufficient documentation

## 2018-05-09 DIAGNOSIS — N3941 Urge incontinence: Secondary | ICD-10-CM | POA: Diagnosis not present

## 2018-05-09 DIAGNOSIS — Z79899 Other long term (current) drug therapy: Secondary | ICD-10-CM | POA: Insufficient documentation

## 2018-05-09 DIAGNOSIS — Z791 Long term (current) use of non-steroidal anti-inflammatories (NSAID): Secondary | ICD-10-CM | POA: Diagnosis not present

## 2018-05-09 DIAGNOSIS — N3281 Overactive bladder: Secondary | ICD-10-CM | POA: Diagnosis not present

## 2018-05-09 HISTORY — PX: BOTOX INJECTION: SHX5754

## 2018-05-09 LAB — POCT I-STAT, CHEM 8
BUN: 18 mg/dL (ref 6–20)
CALCIUM ION: 1.2 mmol/L (ref 1.15–1.40)
CHLORIDE: 99 mmol/L (ref 98–111)
Creatinine, Ser: 0.7 mg/dL (ref 0.44–1.00)
GLUCOSE: 83 mg/dL (ref 70–99)
HCT: 32 % — ABNORMAL LOW (ref 36.0–46.0)
Hemoglobin: 10.9 g/dL — ABNORMAL LOW (ref 12.0–15.0)
Potassium: 3.2 mmol/L — ABNORMAL LOW (ref 3.5–5.1)
Sodium: 139 mmol/L (ref 135–145)
TCO2: 30 mmol/L (ref 22–32)

## 2018-05-09 SURGERY — BOTOX INJECTION
Anesthesia: Monitor Anesthesia Care | Site: Bladder

## 2018-05-09 MED ORDER — STERILE WATER FOR IRRIGATION IR SOLN
Status: DC | PRN
  Administered 2018-05-09: 3000 mL

## 2018-05-09 MED ORDER — PROPOFOL 10 MG/ML IV BOLUS
INTRAVENOUS | Status: AC
  Filled 2018-05-09: qty 40

## 2018-05-09 MED ORDER — CIPROFLOXACIN IN D5W 400 MG/200ML IV SOLN
400.0000 mg | INTRAVENOUS | Status: AC
  Administered 2018-05-09: 400 mg via INTRAVENOUS
  Filled 2018-05-09: qty 200

## 2018-05-09 MED ORDER — DEXAMETHASONE SODIUM PHOSPHATE 10 MG/ML IJ SOLN
INTRAMUSCULAR | Status: DC | PRN
  Administered 2018-05-09: 10 mg via INTRAVENOUS

## 2018-05-09 MED ORDER — ONABOTULINUMTOXINA 100 UNITS IJ SOLR
INTRAMUSCULAR | Status: DC | PRN
  Administered 2018-05-09: 200 [IU] via INTRAMUSCULAR

## 2018-05-09 MED ORDER — MIDAZOLAM HCL 5 MG/5ML IJ SOLN
INTRAMUSCULAR | Status: DC | PRN
  Administered 2018-05-09: 2 mg via INTRAVENOUS

## 2018-05-09 MED ORDER — ONDANSETRON HCL 4 MG/2ML IJ SOLN
4.0000 mg | Freq: Once | INTRAMUSCULAR | Status: DC | PRN
  Filled 2018-05-09: qty 2

## 2018-05-09 MED ORDER — SODIUM CHLORIDE (PF) 0.9 % IJ SOLN
INTRAMUSCULAR | Status: DC | PRN
  Administered 2018-05-09: 20 mL

## 2018-05-09 MED ORDER — ONDANSETRON HCL 4 MG/2ML IJ SOLN
INTRAMUSCULAR | Status: AC
  Filled 2018-05-09: qty 2

## 2018-05-09 MED ORDER — LACTATED RINGERS IV SOLN
INTRAVENOUS | Status: DC
  Administered 2018-05-09: 09:00:00 via INTRAVENOUS
  Filled 2018-05-09: qty 1000

## 2018-05-09 MED ORDER — PROPOFOL 500 MG/50ML IV EMUL
INTRAVENOUS | Status: DC | PRN
  Administered 2018-05-09: 75 ug/kg/min via INTRAVENOUS

## 2018-05-09 MED ORDER — ONDANSETRON HCL 4 MG/2ML IJ SOLN
INTRAMUSCULAR | Status: DC | PRN
  Administered 2018-05-09: 4 mg via INTRAVENOUS

## 2018-05-09 MED ORDER — CIPROFLOXACIN IN D5W 400 MG/200ML IV SOLN
INTRAVENOUS | Status: AC
  Filled 2018-05-09: qty 200

## 2018-05-09 MED ORDER — FENTANYL CITRATE (PF) 100 MCG/2ML IJ SOLN
25.0000 ug | INTRAMUSCULAR | Status: DC | PRN
  Filled 2018-05-09: qty 1

## 2018-05-09 MED ORDER — ONABOTULINUMTOXINA 100 UNITS IJ SOLR: 400.0000 [IU] | Freq: Once | INTRAMUSCULAR | Status: AC

## 2018-05-09 MED ORDER — DEXAMETHASONE SODIUM PHOSPHATE 10 MG/ML IJ SOLN
INTRAMUSCULAR | Status: AC
  Filled 2018-05-09: qty 1

## 2018-05-09 MED ORDER — FENTANYL CITRATE (PF) 100 MCG/2ML IJ SOLN
INTRAMUSCULAR | Status: AC
  Filled 2018-05-09: qty 2

## 2018-05-09 MED ORDER — LIDOCAINE 2% (20 MG/ML) 5 ML SYRINGE
INTRAMUSCULAR | Status: AC
  Filled 2018-05-09: qty 5

## 2018-05-09 MED ORDER — MIDAZOLAM HCL 2 MG/2ML IJ SOLN
INTRAMUSCULAR | Status: AC
  Filled 2018-05-09: qty 2

## 2018-05-09 MED ORDER — FENTANYL CITRATE (PF) 100 MCG/2ML IJ SOLN
INTRAMUSCULAR | Status: DC | PRN
  Administered 2018-05-09: 25 ug via INTRAVENOUS
  Administered 2018-05-09: 50 ug via INTRAVENOUS
  Administered 2018-05-09: 25 ug via INTRAVENOUS

## 2018-05-09 SURGICAL SUPPLY — 15 items
BAG DRAIN URO-CYSTO SKYTR STRL (DRAIN) ×2 IMPLANT
BAG DRN UROCATH (DRAIN) ×1
CATH ROBINSON RED A/P 14FR (CATHETERS) ×2 IMPLANT
CLOTH BEACON ORANGE TIMEOUT ST (SAFETY) ×2 IMPLANT
GLOVE BIO SURGEON STRL SZ7.5 (GLOVE) ×2 IMPLANT
GOWN STRL REUS W/TWL XL LVL3 (GOWN DISPOSABLE) ×2 IMPLANT
KIT TURNOVER CYSTO (KITS) ×2 IMPLANT
MANIFOLD NEPTUNE II (INSTRUMENTS) IMPLANT
NDL SAFETY ECLIPSE 18X1.5 (NEEDLE) IMPLANT
NEEDLE ASPIRATION 22 (NEEDLE) IMPLANT
NEEDLE HYPO 18GX1.5 SHARP (NEEDLE)
PACK CYSTO (CUSTOM PROCEDURE TRAY) ×2 IMPLANT
SYR 20CC LL (SYRINGE) IMPLANT
TUBE CONNECTING 12X1/4 (SUCTIONS) IMPLANT
WATER STERILE IRR 3000ML UROMA (IV SOLUTION) ×2 IMPLANT

## 2018-05-09 NOTE — Discharge Instructions (Signed)
I have reviewed discharge instructions in detail with the patient. They will follow-up with me or their physician as scheduled. My nurse will also be calling the patients as per protocol.    Cystoscopy, Care After Refer to this sheet in the next few weeks. These instructions provide you with information about caring for yourself after your procedure. Your health care provider may also give you more specific instructions. Your treatment has been planned according to current medical practices, but problems sometimes occur. Call your health care provider if you have any problems or questions after your procedure. What can I expect after the procedure? After the procedure, it is common to have:  Mild pain when you urinate. Pain should stop within a few minutes after you urinate. This may last for up to 1 week.  A small amount of blood in your urine for several days.  Feeling like you need to urinate but producing only a small amount of urine.  Follow these instructions at home:  Medicines  Take over-the-counter and prescription medicines only as told by your health care provider.  If you were prescribed an antibiotic medicine, take it as told by your health care provider. Do not stop taking the antibiotic even if you start to feel better. General instructions   Return to your normal activities as told by your health care provider. Ask your health care provider what activities are safe for you.  Do not drive for 24 hours if you received a sedative.  Watch for any blood in your urine. If the amount of blood in your urine increases, call your health care provider.  Follow instructions from your health care provider about eating or drinking restrictions.  If a tissue sample was removed for testing (biopsy) during your procedure, it is your responsibility to get your test results. Ask your health care provider or the department performing the test when your results will be ready.  Drink  enough fluid to keep your urine clear or pale yellow.  Keep all follow-up visits as told by your health care provider. This is important. Contact a health care provider if:  You have pain that gets worse or does not get better with medicine, especially pain when you urinate.  You have difficulty urinating. Get help right away if:  You have more blood in your urine.  You have blood clots in your urine.  You have abdominal pain.  You have a fever or chills.  You are unable to urinate. This information is not intended to replace advice given to you by your health care provider. Make sure you discuss any questions you have with your health care provider. Document Released: 12/22/2004 Document Revised: 11/10/2015 Document Reviewed: 04/21/2015 Elsevier Interactive Patient Education  2018 [place] [facility] Home Care Instructions  Activity: Get plenty of rest for the remainder of the day. A responsible individual must stay with you for 24 hours following the procedure.  For the next 24 hours, DO NOT: -Drive a car -Paediatric nurse -Drink alcoholic beverages -Take any medication unless instructed by your physician -Make any legal decisions or sign important papers.  Meals: Start with liquid foods such as gelatin or soup. Progress to regular foods as tolerated. Avoid greasy, spicy, heavy foods. If nausea and/or vomiting occur, drink only clear liquids until the nausea and/or vomiting subsides. Call your physician if vomiting continues.  Special Instructions/Symptoms: Your throat may feel dry or sore from the anesthesia or the breathing tube placed in your  throat during surgery. If this causes discomfort, gargle with warm salt water. The discomfort should disappear within 24 hours.

## 2018-05-09 NOTE — Op Note (Signed)
Preoperative diagnosis: Neurogenic detrusor overactivity and refractory urgency incontinence Postoperative diagnosis: Refractory urgency incontinence and neurogenic detrusor overactivity Surgery: Cystoscopy and injection of botulinum toxin 200 units Surgeon: Dr. Nicki Reaper Karrah Mangini  The patient was prepped and draped in usual fashion.  Her symptomatic bladder infection had been treated appropriately.  She was given ciprofloxacin preprocedure.  I used 200 units on label for her diagnosis  The injection scope was utilized.  Bladder mucosa and trigone were normal.  Bladder capacity was good.  There is no active cystitis  With my usual template I injected 732 units of Botox and 20 cc in normal healing at 5 and 7:00 and cephalad to the anterior ureteric ridge.  There was no bleeding.  Bladder was empty.  Patient was taken to recovery  I spoke to the patient and she would like to go back on daily Keflex though her compliance varies.  I gave her Diflucan as needed.  She said the Myrbetriq works very well prescription sent

## 2018-05-09 NOTE — Transfer of Care (Signed)
Immediate Anesthesia Transfer of Care Note  Patient: Melissa Mills  Procedure(s) Performed: CYSTOSCOPY BOTOX INJECTION 200 units (N/A Bladder)  Patient Location: PACU  Anesthesia Type:MAC  Level of Consciousness: awake, alert  and oriented  Airway & Oxygen Therapy: Patient Spontanous Breathing  Post-op Assessment: Report given to RN and Post -op Vital signs reviewed and stable  Post vital signs: Reviewed and stable  Last Vitals:  Vitals Value Taken Time  BP 108/55 05/09/2018 10:37 AM  Temp    Pulse 85 05/09/2018 10:40 AM  Resp 12 05/09/2018 10:40 AM  SpO2 99 % 05/09/2018 10:40 AM  Vitals shown include unvalidated device data.  Last Pain:  Vitals:   05/09/18 0819  TempSrc: Oral         Complications: No apparent anesthesia complications

## 2018-05-09 NOTE — Anesthesia Postprocedure Evaluation (Signed)
Anesthesia Post Note  Patient: Melissa Mills  Procedure(s) Performed: CYSTOSCOPY BOTOX INJECTION 200 units (N/A Bladder)     Patient location during evaluation: PACU Anesthesia Type: MAC Level of consciousness: awake and alert Pain management: pain level controlled Vital Signs Assessment: post-procedure vital signs reviewed and stable Respiratory status: spontaneous breathing, nonlabored ventilation, respiratory function stable and patient connected to nasal cannula oxygen Cardiovascular status: stable and blood pressure returned to baseline Postop Assessment: no apparent nausea or vomiting Anesthetic complications: no    Last Vitals:  Vitals:   05/09/18 1130 05/09/18 1222  BP: 116/77 109/70  Pulse: 71 86  Resp: 12 18  Temp:  36.9 C  SpO2: 100% 100%    Last Pain:  Vitals:   05/09/18 1222  TempSrc:   PainSc: 0-No pain                 Ryan P Ellender

## 2018-05-09 NOTE — Interval H&P Note (Signed)
History and Physical Interval Note:  05/09/2018 9:23 AM  Melissa Mills  has presented today for surgery, with the diagnosis of [place] DETRUSOR OVERACTIVITY  The various methods of treatment have been discussed with the patient and family. After consideration of risks, benefits and other options for treatment, the patient has consented to  Procedure(s): CYSTOSCOPY BOTOX INJECTION (N/A) as a surgical intervention .  The patient's history has been reviewed, patient examined, no change in status, stable for surgery.  I have reviewed the patient's chart and labs.  Questions were answered to the patient's satisfaction.     Obert Espindola A

## 2018-05-09 NOTE — Anesthesia Preprocedure Evaluation (Addendum)
Anesthesia Evaluation  Patient identified by MRN, date of birth, ID band Patient awake    Reviewed: Allergy & Precautions, NPO status , Patient's Chart, lab work & pertinent test results  Airway Mallampati: II  TM Distance: >3 FB Neck ROM: Full    Dental no notable dental hx.    Pulmonary neg pulmonary ROS,    Pulmonary exam normal breath sounds clear to auscultation       Cardiovascular hypertension, Pt. on home beta blockers and Pt. on medications Normal cardiovascular exam Rhythm:Regular Rate:Normal  ECG: ST, rate 108   Neuro/Psych  Headaches, PSYCHIATRIC DISORDERS Depression Neurogenic bladder  2005- SPINAL CORD INJURY  Lower paraplegia  2005 THORACIC EPIDURAL HEMATOMA  Neuromuscular disease    GI/Hepatic GERD  Medicated and Controlled,(+)     substance abuse  ,   Endo/Other  Hypothyroidism   Renal/GU negative Renal ROS     Musculoskeletal  (+) narcotic dependentBack pain, chronic   Abdominal   Peds  Hematology negative hematology ROS (+)   Anesthesia Other Findings REFRACTORY URGENCY INCONTINENCE AND NUROGENIC DETRUSOR OVERACTIVITY  Reproductive/Obstetrics                            Anesthesia Physical Anesthesia Plan  ASA: III  Anesthesia Plan: MAC   Post-op Pain Management:    Induction: Intravenous  PONV Risk Score and Plan: 2 and Propofol infusion and Treatment may vary due to age or medical condition  Airway Management Planned: Simple Face Mask  Additional Equipment:   Intra-op Plan:   Post-operative Plan:   Informed Consent: I have reviewed the patients History and Physical, chart, labs and discussed the procedure including the risks, benefits and alternatives for the proposed anesthesia with the patient or authorized representative who has indicated his/her understanding and acceptance.   Dental advisory given  Plan Discussed with: CRNA  Anesthesia Plan  Comments:         Anesthesia Quick Evaluation

## 2018-05-09 NOTE — H&P (Signed)
The patient clinically is not infected on daily Keflex. Urine culture about 1 week ago was negative. Incontinence is stable   She underwent Botox with the usual template though because of her spasticity I could not inject the lateral walls but I injected higher recognizing she is on clean intermittent catheterization. She tolerated the procedure beautifully. There was no cystitis. Trigone was identified and not injected. We will call her tomorrow. this is her 1st treatment of 200 units under local and she did great. She will call us in 2 weeks to tell us the results      ALLERGIES: CeleBREX CAPS Codeine Derivatives Erythromycin Macrobid CAPS Sulfa Drugs Topamax Wellbutrin    MEDICATIONS: Keflex 250 mg capsule 1 capsule PO Daily  Adderall XR CP24 Oral  Advil TABS Oral  Baclofen 20 mg tablet Oral  Cephalexin 500 mg capsule 0 Oral  Cymbalta 60 MG Oral Capsule Delayed Release Particles Oral  Depakote TBEC Oral  Dexilant 60 mg capsule, delayed release, biphasic Oral  Diazepam 5 mg tablet Oral  Ibuprofen 800 MG Oral Tablet Oral  Maxalt TABS Oral  Methenamine Hippurate 1 gram tablet 0 Oral  Monurol 3 gram packet 1 Oral Once  Ms Contin 15 mg tablet, extended release Oral  Msir 15 mg tablet Oral  Myrbetriq 50 mg tablet, extended release 24 hr 1 Oral  Oxybutynin Chloride Er 15 mg tablet, extended release 24 hr 0 Oral  Phenergan TABS Oral  Relpax 40 mg tablet Oral  Sonata CAPS Oral  Sumatriptan Succinate 6 mg/0.5 ml cartridge Subcutaneous  Trimethoprim 100 mg tablet 1 Oral  Valium 5 MG Oral Tablet Oral  Xanax 0.5 MG Oral Tablet Oral  Zanaflex CAPS Oral  Zofran TABS Oral     GU PSH: Cystoscopy Hydrodistention - 2009 Urethrolysis - 2015, 2012, 2011, 2010     [facility] Notes: Urologic Surgery, Urologic Surgery, Urologic Surgery, Urologic Surgery, Cystoscopy With Dilation Of Bladder   NON-GU PSH: No Non-GU PSH     GU PMH: Bladder, Neuromuscular dysfunction, Unspec, Neurogenic  bladder - 2016 Chronic cystitis (w/o hematuria), Chronic cystitis - 2016 Urge incontinence, Urge incontinence of urine - 2015 Abdominal Pain Unspec, Abdominal pain - 2014 Dorsalgia, Unspec, Backache - 2014 Gross hematuria, Gross hematuria - 2014 Urinary Frequency, Increased urinary frequency - 2014 Urinary Retention, Unspec, Incomplete bladder emptying - 2014 Urinary Tract Inf, Unspec site, Urinary tract infection - 2014     PMH Notes:  2006-05-28 11:17:06 - Note: Anxiety  2006-05-28 11:17:06 - Note: Recent Methicillin-resistant Staphylococcus Aureus Infection   NON-GU PMH: Encounter for general adult medical examination without abnormal findings, Encounter for preventive health examination - 2016 Personal history of other (healed) physical injury and trauma, History of spinal cord injury - 2014 Personal history of other mental and behavioral disorders, History of depression - 2014 Seizure disorder, Convulsions (As Sx) - 2014    FAMILY HISTORY: Acute Myocardial Infarction - Father, Grandmother Brain tumor - Grandfather, Uncle Breast Cancer - Mother Family Health Status Number - Runs In Family Urologic Disorder - Father   SOCIAL HISTORY: Marital Status: Married    Notes: Never A Smoker, Occupation:, Alcohol Use, Tobacco Use, Marital History - Currently Married, Caffeine Use   REVIEW OF SYSTEMS:    GU Review Female:  Patient denies frequent urination, hard to postpone urination, burning /pain with urination, get up at night to urinate, leakage of urine, stream starts and stops, trouble starting your stream, have to strain to urinate, and being pregnant.  Gastrointestinal (Upper):  Patient denies nausea, vomiting, and indigestion/ heartburn.  Gastrointestinal (Lower):  Patient denies diarrhea and constipation.  Constitutional:  Patient denies fever, weight loss, night sweats, and fatigue.  Skin:  Patient denies skin rash/ lesion and itching.  Eyes:  Patient denies  blurred vision and double vision.  Ears/ Nose/ Throat:  Patient denies sore throat and sinus problems.  Hematologic/Lymphatic:  Patient denies swollen glands and easy bruising.  Cardiovascular:  Patient denies leg swelling and chest pains.  Respiratory:  Patient denies cough and shortness of breath.  Endocrine:  Patient denies excessive thirst.  Musculoskeletal:  Patient denies back pain and joint pain.  Neurological:  Patient denies headaches and dizziness.  Psychologic:  Patient denies depression and anxiety.   VITAL SIGNS:     05/15/2017 07:29 AM  BP 135/69 mmHg  Heart Rate 100 /min   PAST DATA REVIEWED:   Source Of History:  Patient   PROCEDURES:       Botox 100mg  - 00923, [id number]  Preservice: Risks, benefits, and some of the potential complications of the procedure were discussed at length with the patient including infection, bleeding, voiding discomfort, urinary retention, fever, chills, sepsis, Black Box warning, and others. All questions were answered. Informed consent was obtained. Antibiotic prophylaxis was given. Sterile technique and urothelial analgesia were used.  Meatus:  Normal size. Normal location. Normal condition.  Urethra:  No hypermobility. No leakage.  Ureteral Orifices:  Normal location. Normal size. Normal shape. Effluxed clear urine.  Bladder:  No trabeculation. No tumors. Normal mucosa. No stones.      100 units of Botox was mixed with 10 cc of NaCl using sterile technique. I injected the Botox at 5 and 7 o'clock and cephalid to the interureteric ridge into the lower 1/3 of the bladder using the Laborie needle and cystoscopy. 20 injections sites were utilized. There was little to no bleeding. The bladder was left comfortably full and voiding trial was performed. Post procedure instructions were given and patient will be followed as per protocol. The procedure was well tolerated.    Urinalysis  Dipstick Dipstick Cont'd  Color: Yellow Bilirubin:  Neg  Appearance: Clear Ketones: Neg  Specific Gravity: 1.020 Blood: Neg  pH: 7.0 Protein: Neg  Glucose: Neg Urobilinogen: 0.2   Nitrites: Neg   Leukocyte Esterase: Neg        ASSESSMENT:  After a thorough review of the management options for the patient's condition the patient  elected to proceed with surgical therapy as noted above. We have discussed the potential benefits and risks of the procedure, side effects of the proposed treatment, the likelihood of the patient achieving the goals of the procedure, and any potential problems that might occur during the procedure or recuperation. Informed consent has been obtained.

## 2018-05-12 ENCOUNTER — Encounter (HOSPITAL_BASED_OUTPATIENT_CLINIC_OR_DEPARTMENT_OTHER): Payer: Self-pay | Admitting: Urology

## 2018-05-21 DIAGNOSIS — G43109 Migraine with aura, not intractable, without status migrainosus: Secondary | ICD-10-CM | POA: Diagnosis not present

## 2018-05-21 DIAGNOSIS — G8111 Spastic hemiplegia affecting right dominant side: Secondary | ICD-10-CM | POA: Diagnosis not present

## 2018-05-21 DIAGNOSIS — F331 Major depressive disorder, recurrent, moderate: Secondary | ICD-10-CM | POA: Diagnosis not present

## 2018-05-21 DIAGNOSIS — E559 Vitamin D deficiency, unspecified: Secondary | ICD-10-CM | POA: Diagnosis not present

## 2018-05-21 DIAGNOSIS — F900 Attention-deficit hyperactivity disorder, predominantly inattentive type: Secondary | ICD-10-CM | POA: Diagnosis not present

## 2018-05-21 DIAGNOSIS — I10 Essential (primary) hypertension: Secondary | ICD-10-CM | POA: Diagnosis not present

## 2018-05-21 DIAGNOSIS — F419 Anxiety disorder, unspecified: Secondary | ICD-10-CM | POA: Diagnosis not present

## 2018-05-21 DIAGNOSIS — K219 Gastro-esophageal reflux disease without esophagitis: Secondary | ICD-10-CM | POA: Diagnosis not present

## 2018-05-21 DIAGNOSIS — E039 Hypothyroidism, unspecified: Secondary | ICD-10-CM | POA: Diagnosis not present

## 2018-05-23 DIAGNOSIS — N3941 Urge incontinence: Secondary | ICD-10-CM | POA: Diagnosis not present

## 2018-05-23 DIAGNOSIS — N319 Neuromuscular dysfunction of bladder, unspecified: Secondary | ICD-10-CM | POA: Diagnosis not present

## 2018-06-12 DIAGNOSIS — Z1239 Encounter for other screening for malignant neoplasm of breast: Secondary | ICD-10-CM | POA: Diagnosis not present

## 2018-06-12 DIAGNOSIS — D649 Anemia, unspecified: Secondary | ICD-10-CM | POA: Diagnosis not present

## 2018-06-12 DIAGNOSIS — Z13220 Encounter for screening for lipoid disorders: Secondary | ICD-10-CM | POA: Diagnosis not present

## 2018-06-12 DIAGNOSIS — Z23 Encounter for immunization: Secondary | ICD-10-CM | POA: Diagnosis not present

## 2018-06-12 DIAGNOSIS — H6120 Impacted cerumen, unspecified ear: Secondary | ICD-10-CM | POA: Diagnosis not present

## 2018-06-12 DIAGNOSIS — Z0000 Encounter for general adult medical examination without abnormal findings: Secondary | ICD-10-CM | POA: Diagnosis not present

## 2018-06-12 DIAGNOSIS — Z124 Encounter for screening for malignant neoplasm of cervix: Secondary | ICD-10-CM | POA: Diagnosis not present

## 2018-06-12 DIAGNOSIS — E876 Hypokalemia: Secondary | ICD-10-CM | POA: Diagnosis not present

## 2018-06-13 ENCOUNTER — Other Ambulatory Visit: Payer: Self-pay | Admitting: Family Medicine

## 2018-06-13 DIAGNOSIS — Z1231 Encounter for screening mammogram for malignant neoplasm of breast: Secondary | ICD-10-CM

## 2018-06-16 DIAGNOSIS — G8111 Spastic hemiplegia affecting right dominant side: Secondary | ICD-10-CM | POA: Diagnosis not present

## 2018-07-02 DIAGNOSIS — Z87828 Personal history of other (healed) physical injury and trauma: Secondary | ICD-10-CM | POA: Diagnosis not present

## 2018-07-02 DIAGNOSIS — S24109S Unspecified injury at unspecified level of thoracic spinal cord, sequela: Secondary | ICD-10-CM | POA: Diagnosis not present

## 2018-07-02 DIAGNOSIS — F331 Major depressive disorder, recurrent, moderate: Secondary | ICD-10-CM | POA: Diagnosis not present

## 2018-07-02 DIAGNOSIS — M5137 Other intervertebral disc degeneration, lumbosacral region: Secondary | ICD-10-CM | POA: Diagnosis not present

## 2018-07-02 DIAGNOSIS — M25519 Pain in unspecified shoulder: Secondary | ICD-10-CM | POA: Diagnosis not present

## 2018-07-02 DIAGNOSIS — S24101A Unspecified injury at T1 level of thoracic spinal cord, initial encounter: Secondary | ICD-10-CM | POA: Diagnosis not present

## 2018-07-02 DIAGNOSIS — Z79891 Long term (current) use of opiate analgesic: Secondary | ICD-10-CM | POA: Diagnosis not present

## 2018-07-02 DIAGNOSIS — G8928 Other chronic postprocedural pain: Secondary | ICD-10-CM | POA: Diagnosis not present

## 2018-07-16 DIAGNOSIS — R300 Dysuria: Secondary | ICD-10-CM | POA: Diagnosis not present

## 2018-07-16 DIAGNOSIS — G8111 Spastic hemiplegia affecting right dominant side: Secondary | ICD-10-CM | POA: Diagnosis not present

## 2018-07-18 ENCOUNTER — Ambulatory Visit
Admission: RE | Admit: 2018-07-18 | Discharge: 2018-07-18 | Disposition: A | Discharge status: Home / Self Care | Payer: Medicare HMO | Source: Ambulatory Visit | Attending: Family Medicine | Admitting: Family Medicine

## 2018-07-18 DIAGNOSIS — Z1231 Encounter for screening mammogram for malignant neoplasm of breast: Secondary | ICD-10-CM | POA: Diagnosis not present

## 2018-07-21 ENCOUNTER — Other Ambulatory Visit: Payer: Self-pay | Admitting: Family Medicine

## 2018-07-21 DIAGNOSIS — R928 Other abnormal and inconclusive findings on diagnostic imaging of breast: Secondary | ICD-10-CM

## 2018-07-23 ENCOUNTER — Ambulatory Visit
Admission: RE | Admit: 2018-07-23 | Discharge: 2018-07-23 | Disposition: A | Discharge status: Home / Self Care | Payer: Medicare HMO | Source: Ambulatory Visit | Attending: Family Medicine | Admitting: Family Medicine

## 2018-07-23 DIAGNOSIS — R922 Inconclusive mammogram: Secondary | ICD-10-CM | POA: Diagnosis not present

## 2018-07-23 DIAGNOSIS — N6489 Other specified disorders of breast: Secondary | ICD-10-CM | POA: Diagnosis not present

## 2018-07-23 DIAGNOSIS — R928 Other abnormal and inconclusive findings on diagnostic imaging of breast: Secondary | ICD-10-CM

## 2018-07-25 ENCOUNTER — Other Ambulatory Visit: Payer: Medicare HMO

## 2018-08-22 DIAGNOSIS — M25561 Pain in right knee: Secondary | ICD-10-CM | POA: Diagnosis not present

## 2018-08-22 DIAGNOSIS — M25571 Pain in right ankle and joints of right foot: Secondary | ICD-10-CM | POA: Diagnosis not present

## 2018-08-22 DIAGNOSIS — M79671 Pain in right foot: Secondary | ICD-10-CM | POA: Diagnosis not present

## 2018-08-26 DIAGNOSIS — Z7689 Persons encountering health services in other specified circumstances: Secondary | ICD-10-CM | POA: Diagnosis not present

## 2018-08-26 DIAGNOSIS — E039 Hypothyroidism, unspecified: Secondary | ICD-10-CM | POA: Diagnosis not present

## 2018-08-26 DIAGNOSIS — M542 Cervicalgia: Secondary | ICD-10-CM | POA: Diagnosis not present

## 2018-08-26 DIAGNOSIS — F902 Attention-deficit hyperactivity disorder, combined type: Secondary | ICD-10-CM | POA: Diagnosis not present

## 2018-08-26 DIAGNOSIS — M5442 Lumbago with sciatica, left side: Secondary | ICD-10-CM | POA: Diagnosis not present

## 2018-08-26 DIAGNOSIS — F331 Major depressive disorder, recurrent, moderate: Secondary | ICD-10-CM | POA: Diagnosis not present

## 2018-08-26 DIAGNOSIS — G8929 Other chronic pain: Secondary | ICD-10-CM | POA: Diagnosis not present

## 2018-08-27 DIAGNOSIS — M79672 Pain in left foot: Secondary | ICD-10-CM | POA: Diagnosis not present

## 2018-08-27 DIAGNOSIS — M21371 Foot drop, right foot: Secondary | ICD-10-CM | POA: Diagnosis not present

## 2018-09-01 DIAGNOSIS — R2242 Localized swelling, mass and lump, left lower limb: Secondary | ICD-10-CM | POA: Diagnosis not present

## 2018-10-01 DIAGNOSIS — S24101A Unspecified injury at T1 level of thoracic spinal cord, initial encounter: Secondary | ICD-10-CM | POA: Diagnosis not present

## 2018-10-15 DIAGNOSIS — M79672 Pain in left foot: Secondary | ICD-10-CM | POA: Diagnosis not present

## 2018-10-15 DIAGNOSIS — M79671 Pain in right foot: Secondary | ICD-10-CM | POA: Diagnosis not present

## 2018-10-15 DIAGNOSIS — M25561 Pain in right knee: Secondary | ICD-10-CM | POA: Diagnosis not present

## 2018-10-16 DIAGNOSIS — R2242 Localized swelling, mass and lump, left lower limb: Secondary | ICD-10-CM | POA: Diagnosis not present

## 2018-10-16 DIAGNOSIS — I10 Essential (primary) hypertension: Secondary | ICD-10-CM | POA: Diagnosis not present

## 2018-10-20 DIAGNOSIS — M25561 Pain in right knee: Secondary | ICD-10-CM | POA: Diagnosis not present

## 2018-10-22 DIAGNOSIS — R2242 Localized swelling, mass and lump, left lower limb: Secondary | ICD-10-CM | POA: Diagnosis not present

## 2018-11-25 DIAGNOSIS — M7989 Other specified soft tissue disorders: Secondary | ICD-10-CM | POA: Diagnosis not present

## 2018-11-25 DIAGNOSIS — R252 Cramp and spasm: Secondary | ICD-10-CM | POA: Diagnosis not present

## 2018-12-03 DIAGNOSIS — Z1159 Encounter for screening for other viral diseases: Secondary | ICD-10-CM | POA: Diagnosis not present

## 2018-12-03 DIAGNOSIS — M7989 Other specified soft tissue disorders: Secondary | ICD-10-CM | POA: Diagnosis not present

## 2018-12-03 DIAGNOSIS — Z01812 Encounter for preprocedural laboratory examination: Secondary | ICD-10-CM | POA: Diagnosis not present

## 2018-12-08 DIAGNOSIS — I10 Essential (primary) hypertension: Secondary | ICD-10-CM | POA: Diagnosis not present

## 2018-12-08 DIAGNOSIS — M7989 Other specified soft tissue disorders: Secondary | ICD-10-CM | POA: Diagnosis not present

## 2018-12-08 DIAGNOSIS — K219 Gastro-esophageal reflux disease without esophagitis: Secondary | ICD-10-CM | POA: Diagnosis not present

## 2018-12-08 DIAGNOSIS — L720 Epidermal cyst: Secondary | ICD-10-CM | POA: Diagnosis not present

## 2018-12-08 HISTORY — PX: LOWER LEG SOFT TISSUE TUMOR EXCISION: SUR553

## 2018-12-23 DIAGNOSIS — M7989 Other specified soft tissue disorders: Secondary | ICD-10-CM | POA: Diagnosis not present

## 2018-12-25 DIAGNOSIS — H524 Presbyopia: Secondary | ICD-10-CM | POA: Diagnosis not present

## 2018-12-30 DIAGNOSIS — M7989 Other specified soft tissue disorders: Secondary | ICD-10-CM | POA: Diagnosis not present

## 2018-12-30 DIAGNOSIS — Z4789 Encounter for other orthopedic aftercare: Secondary | ICD-10-CM | POA: Diagnosis not present

## 2019-01-08 DIAGNOSIS — S24109D Unspecified injury at unspecified level of thoracic spinal cord, subsequent encounter: Secondary | ICD-10-CM | POA: Diagnosis not present

## 2019-01-08 DIAGNOSIS — X58XXXD Exposure to other specified factors, subsequent encounter: Secondary | ICD-10-CM | POA: Diagnosis not present

## 2019-01-12 DIAGNOSIS — G8929 Other chronic pain: Secondary | ICD-10-CM | POA: Diagnosis not present

## 2019-01-12 DIAGNOSIS — M5442 Lumbago with sciatica, left side: Secondary | ICD-10-CM | POA: Diagnosis not present

## 2019-01-13 DIAGNOSIS — Z4889 Encounter for other specified surgical aftercare: Secondary | ICD-10-CM | POA: Diagnosis not present

## 2019-01-16 DIAGNOSIS — Z4889 Encounter for other specified surgical aftercare: Secondary | ICD-10-CM | POA: Diagnosis not present

## 2019-01-20 DIAGNOSIS — M7989 Other specified soft tissue disorders: Secondary | ICD-10-CM | POA: Diagnosis not present

## 2019-01-21 DIAGNOSIS — M5442 Lumbago with sciatica, left side: Secondary | ICD-10-CM | POA: Diagnosis not present

## 2019-01-21 DIAGNOSIS — G8929 Other chronic pain: Secondary | ICD-10-CM | POA: Diagnosis not present

## 2019-02-03 DIAGNOSIS — T8131XA Disruption of external operation (surgical) wound, not elsewhere classified, initial encounter: Secondary | ICD-10-CM | POA: Diagnosis not present

## 2019-02-03 DIAGNOSIS — L720 Epidermal cyst: Secondary | ICD-10-CM | POA: Diagnosis not present

## 2019-03-10 DIAGNOSIS — T8130XA Disruption of wound, unspecified, initial encounter: Secondary | ICD-10-CM | POA: Diagnosis not present

## 2019-03-10 DIAGNOSIS — M7042 Prepatellar bursitis, left knee: Secondary | ICD-10-CM | POA: Diagnosis not present

## 2019-03-10 DIAGNOSIS — M25462 Effusion, left knee: Secondary | ICD-10-CM | POA: Diagnosis not present

## 2019-03-18 DIAGNOSIS — M961 Postlaminectomy syndrome, not elsewhere classified: Secondary | ICD-10-CM | POA: Diagnosis not present

## 2019-03-18 DIAGNOSIS — S24101A Unspecified injury at T1 level of thoracic spinal cord, initial encounter: Secondary | ICD-10-CM | POA: Diagnosis not present

## 2019-04-15 DIAGNOSIS — I10 Essential (primary) hypertension: Secondary | ICD-10-CM | POA: Diagnosis not present

## 2019-04-15 DIAGNOSIS — Z1329 Encounter for screening for other suspected endocrine disorder: Secondary | ICD-10-CM | POA: Diagnosis not present

## 2019-04-15 DIAGNOSIS — Z13228 Encounter for screening for other metabolic disorders: Secondary | ICD-10-CM | POA: Diagnosis not present

## 2019-04-15 DIAGNOSIS — R195 Other fecal abnormalities: Secondary | ICD-10-CM | POA: Diagnosis not present

## 2019-04-15 DIAGNOSIS — Z13220 Encounter for screening for lipoid disorders: Secondary | ICD-10-CM | POA: Diagnosis not present

## 2019-04-15 DIAGNOSIS — Z79899 Other long term (current) drug therapy: Secondary | ICD-10-CM | POA: Diagnosis not present

## 2019-04-15 DIAGNOSIS — Z975 Presence of (intrauterine) contraceptive device: Secondary | ICD-10-CM | POA: Diagnosis not present

## 2019-04-15 DIAGNOSIS — Z130 Encounter for screening for diseases of the blood and blood-forming organs and certain disorders involving the immune mechanism: Secondary | ICD-10-CM | POA: Diagnosis not present

## 2019-04-15 DIAGNOSIS — R140 Abdominal distension (gaseous): Secondary | ICD-10-CM | POA: Diagnosis not present

## 2019-04-21 DIAGNOSIS — K5900 Constipation, unspecified: Secondary | ICD-10-CM | POA: Diagnosis not present

## 2019-04-21 DIAGNOSIS — R1084 Generalized abdominal pain: Secondary | ICD-10-CM | POA: Diagnosis not present

## 2019-05-12 DIAGNOSIS — Z5181 Encounter for therapeutic drug level monitoring: Secondary | ICD-10-CM | POA: Diagnosis not present

## 2019-05-12 DIAGNOSIS — G801 Spastic diplegic cerebral palsy: Secondary | ICD-10-CM | POA: Diagnosis not present

## 2019-05-12 DIAGNOSIS — S24101S Unspecified injury at T1 level of thoracic spinal cord, sequela: Secondary | ICD-10-CM | POA: Diagnosis not present

## 2019-05-12 DIAGNOSIS — S24109S Unspecified injury at unspecified level of thoracic spinal cord, sequela: Secondary | ICD-10-CM | POA: Diagnosis not present

## 2019-05-12 DIAGNOSIS — Z79899 Other long term (current) drug therapy: Secondary | ICD-10-CM | POA: Diagnosis not present

## 2019-05-27 IMAGING — MG DIGITAL SCREENING BILATERAL MAMMOGRAM WITH TOMO AND CAD
6 of 10 series · 6 of 30 positions shown · non-contrast
Comparison: Previous exam(s).

CLINICAL DATA: Screening.

EXAM:
DIGITAL SCREENING BILATERAL MAMMOGRAM WITH TOMO AND CAD

[R MLO synth-2D (1 of 2)]
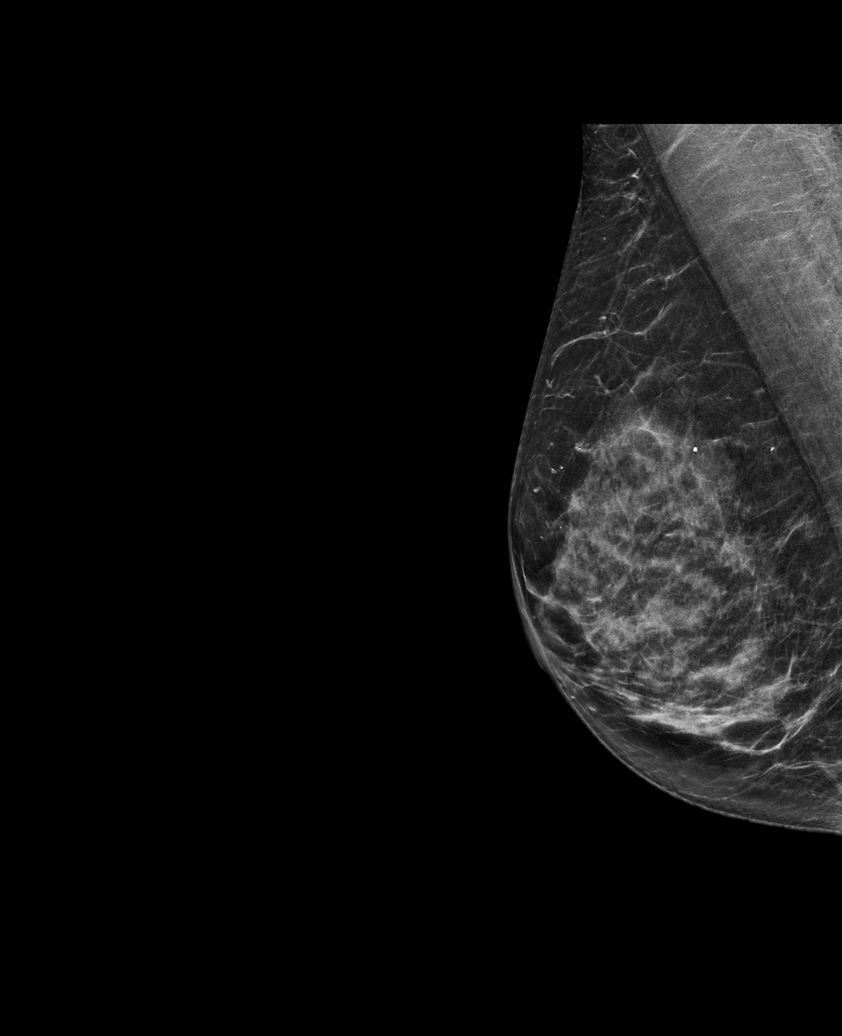

[L MLO synth-2D]
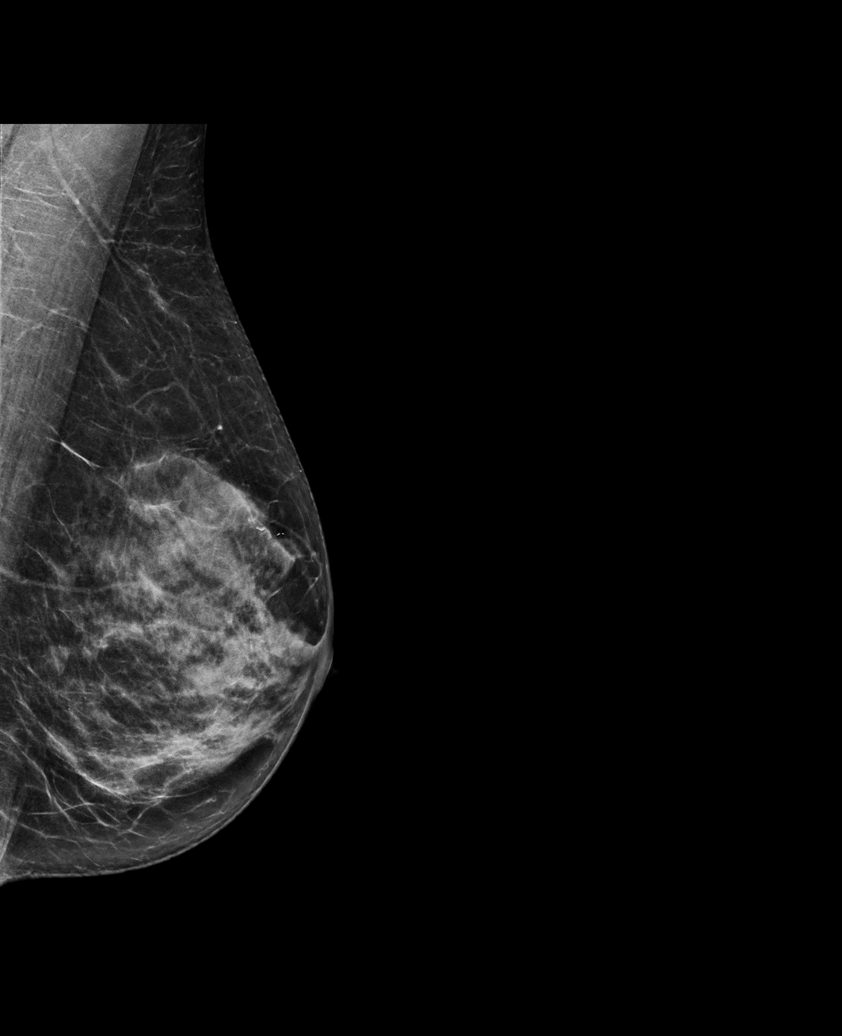

[R CC synth-2D]
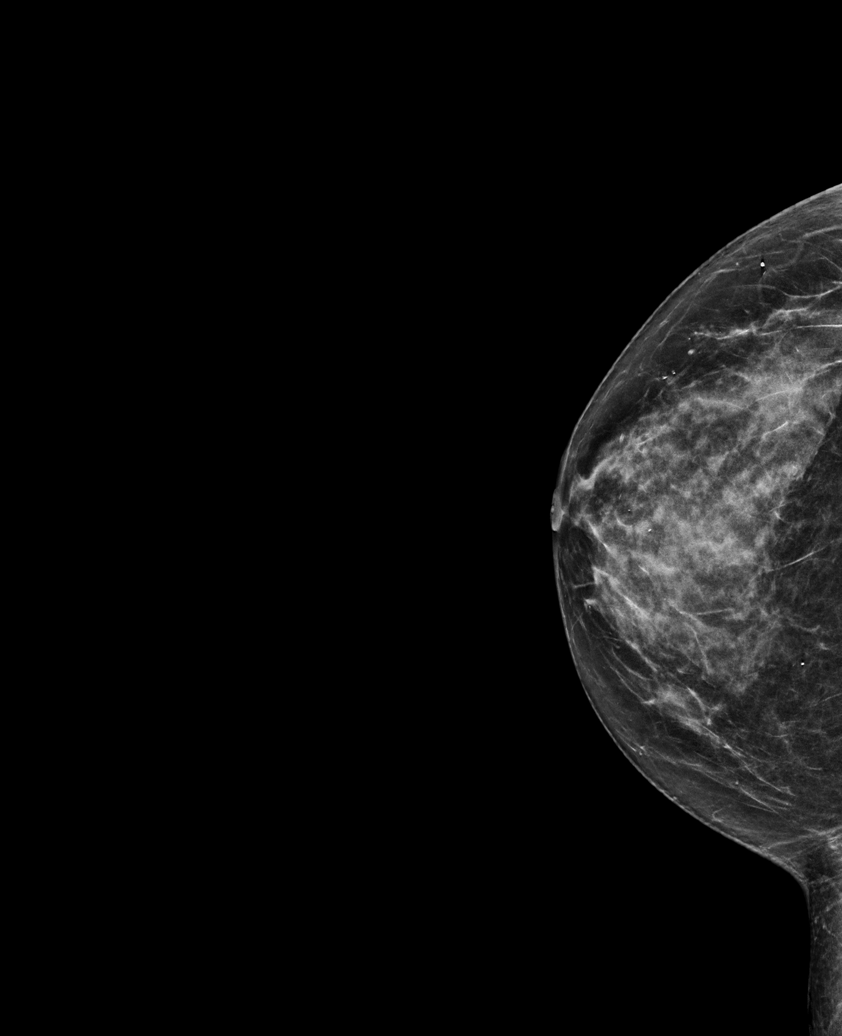

[L CC synth-2D]
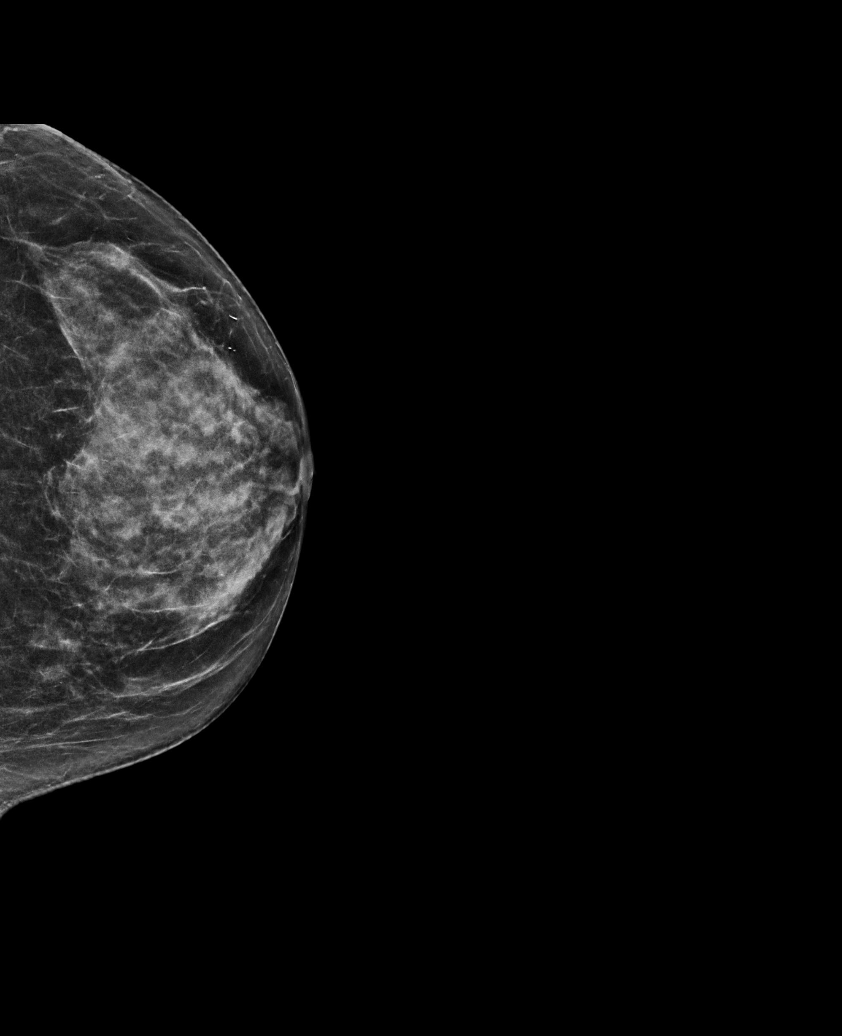

[R MLO synth-2D (2 of 2)]
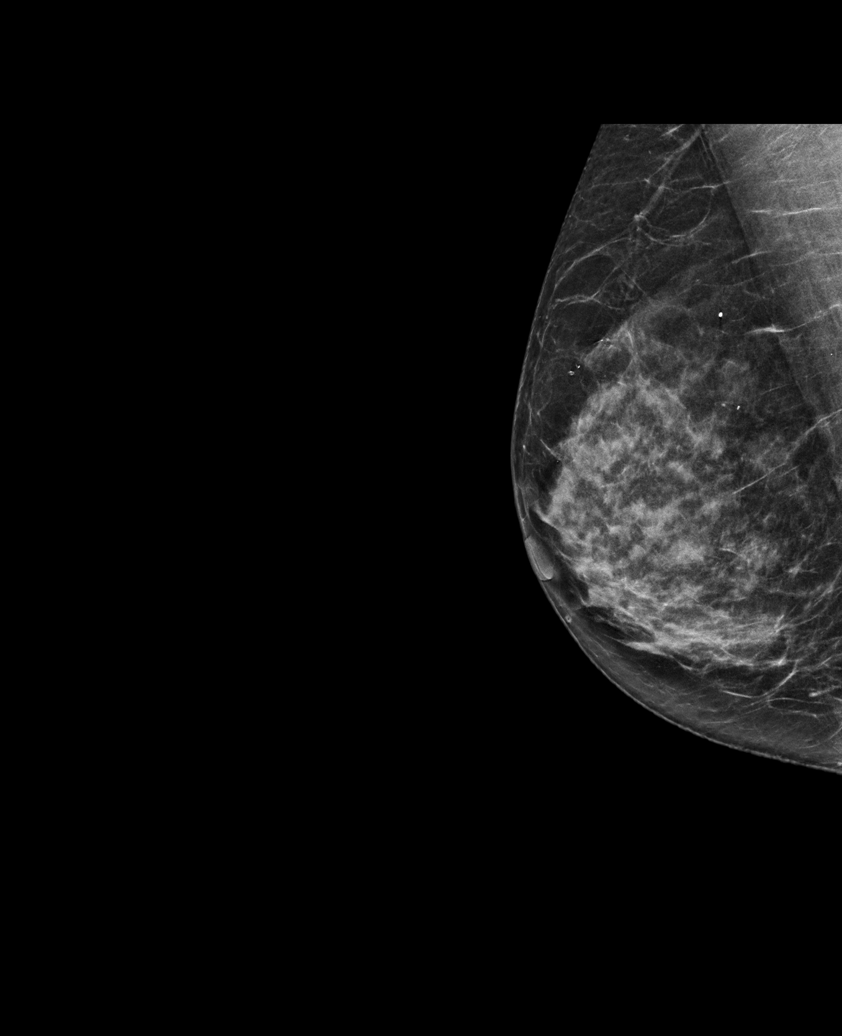

[L MLO tomo · tomo slice 31/62.0]
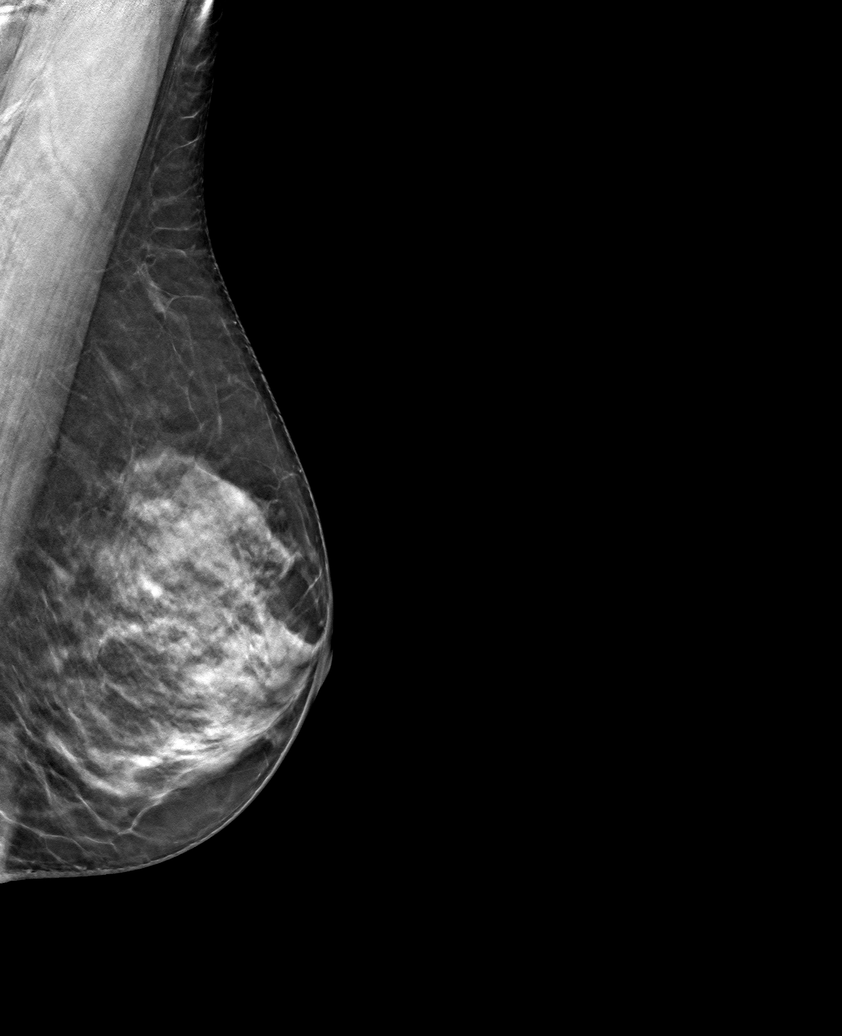

[6 of 30 positions shown; findings below may reference images not displayed]

ACR Breast Density Category d: The breast tissue is extremely dense,
which lowers the sensitivity of mammography.
FINDINGS: In the left breast, a possible asymmetry warrants further
evaluation. In the right breast, no findings suspicious for
malignancy. Images were processed with CAD.
IMPRESSION: Further evaluation is suggested for possible asymmetry in the left
breast.

RECOMMENDATION:
Diagnostic mammogram and possibly ultrasound of the left breast.
(Code:[id number])

The patient will be contacted regarding the findings, and additional
imaging will be scheduled.

BI-RADS CATEGORY  0: Incomplete. Need additional imaging evaluation
and/or prior mammograms for comparison.

## 2019-06-29 DIAGNOSIS — R269 Unspecified abnormalities of gait and mobility: Secondary | ICD-10-CM | POA: Diagnosis not present

## 2019-06-29 DIAGNOSIS — M21371 Foot drop, right foot: Secondary | ICD-10-CM | POA: Diagnosis not present

## 2019-06-29 DIAGNOSIS — M961 Postlaminectomy syndrome, not elsewhere classified: Secondary | ICD-10-CM | POA: Diagnosis not present

## 2019-06-29 DIAGNOSIS — S24101A Unspecified injury at T1 level of thoracic spinal cord, initial encounter: Secondary | ICD-10-CM | POA: Diagnosis not present

## 2019-07-15 DIAGNOSIS — F900 Attention-deficit hyperactivity disorder, predominantly inattentive type: Secondary | ICD-10-CM | POA: Diagnosis not present

## 2019-07-15 DIAGNOSIS — E039 Hypothyroidism, unspecified: Secondary | ICD-10-CM | POA: Diagnosis not present

## 2019-07-15 DIAGNOSIS — N3001 Acute cystitis with hematuria: Secondary | ICD-10-CM | POA: Diagnosis not present

## 2019-07-15 DIAGNOSIS — F419 Anxiety disorder, unspecified: Secondary | ICD-10-CM | POA: Diagnosis not present

## 2019-07-15 DIAGNOSIS — K219 Gastro-esophageal reflux disease without esophagitis: Secondary | ICD-10-CM | POA: Diagnosis not present

## 2019-07-15 DIAGNOSIS — I10 Essential (primary) hypertension: Secondary | ICD-10-CM | POA: Diagnosis not present

## 2019-07-15 DIAGNOSIS — F3342 Major depressive disorder, recurrent, in full remission: Secondary | ICD-10-CM | POA: Diagnosis not present

## 2019-07-15 DIAGNOSIS — S24101A Unspecified injury at T1 level of thoracic spinal cord, initial encounter: Secondary | ICD-10-CM | POA: Diagnosis not present

## 2019-07-15 DIAGNOSIS — E079 Disorder of thyroid, unspecified: Secondary | ICD-10-CM | POA: Diagnosis not present

## 2019-07-20 ENCOUNTER — Other Ambulatory Visit: Payer: Self-pay | Admitting: Family

## 2019-07-20 DIAGNOSIS — Z1231 Encounter for screening mammogram for malignant neoplasm of breast: Secondary | ICD-10-CM

## 2019-07-22 ENCOUNTER — Ambulatory Visit: Payer: Medicare HMO

## 2019-09-23 DIAGNOSIS — S24109S Unspecified injury at unspecified level of thoracic spinal cord, sequela: Secondary | ICD-10-CM | POA: Diagnosis not present

## 2019-09-23 DIAGNOSIS — Z79891 Long term (current) use of opiate analgesic: Secondary | ICD-10-CM | POA: Diagnosis not present

## 2019-09-23 DIAGNOSIS — M5137 Other intervertebral disc degeneration, lumbosacral region: Secondary | ICD-10-CM | POA: Diagnosis not present

## 2019-09-23 DIAGNOSIS — M961 Postlaminectomy syndrome, not elsewhere classified: Secondary | ICD-10-CM | POA: Diagnosis not present

## 2019-09-23 DIAGNOSIS — S24101A Unspecified injury at T1 level of thoracic spinal cord, initial encounter: Secondary | ICD-10-CM | POA: Diagnosis not present

## 2019-10-12 DIAGNOSIS — Z124 Encounter for screening for malignant neoplasm of cervix: Secondary | ICD-10-CM | POA: Diagnosis not present

## 2019-10-14 DIAGNOSIS — L709 Acne, unspecified: Secondary | ICD-10-CM | POA: Diagnosis not present

## 2019-10-14 DIAGNOSIS — G8929 Other chronic pain: Secondary | ICD-10-CM | POA: Diagnosis not present

## 2019-10-14 DIAGNOSIS — F5101 Primary insomnia: Secondary | ICD-10-CM | POA: Diagnosis not present

## 2019-10-14 DIAGNOSIS — F419 Anxiety disorder, unspecified: Secondary | ICD-10-CM | POA: Diagnosis not present

## 2019-10-14 DIAGNOSIS — E559 Vitamin D deficiency, unspecified: Secondary | ICD-10-CM | POA: Diagnosis not present

## 2019-10-14 DIAGNOSIS — E876 Hypokalemia: Secondary | ICD-10-CM | POA: Diagnosis not present

## 2019-10-14 DIAGNOSIS — I10 Essential (primary) hypertension: Secondary | ICD-10-CM | POA: Diagnosis not present

## 2019-10-14 DIAGNOSIS — F900 Attention-deficit hyperactivity disorder, predominantly inattentive type: Secondary | ICD-10-CM | POA: Diagnosis not present

## 2019-10-14 DIAGNOSIS — E039 Hypothyroidism, unspecified: Secondary | ICD-10-CM | POA: Diagnosis not present

## 2019-11-18 DIAGNOSIS — M961 Postlaminectomy syndrome, not elsewhere classified: Secondary | ICD-10-CM | POA: Diagnosis not present

## 2019-11-18 DIAGNOSIS — Y658 Other specified misadventures during surgical and medical care: Secondary | ICD-10-CM | POA: Diagnosis not present

## 2019-11-18 DIAGNOSIS — G039 Meningitis, unspecified: Secondary | ICD-10-CM | POA: Diagnosis not present

## 2019-11-18 DIAGNOSIS — S24109S Unspecified injury at unspecified level of thoracic spinal cord, sequela: Secondary | ICD-10-CM | POA: Diagnosis not present

## 2019-12-24 DIAGNOSIS — L821 Other seborrheic keratosis: Secondary | ICD-10-CM | POA: Diagnosis not present

## 2019-12-24 DIAGNOSIS — L700 Acne vulgaris: Secondary | ICD-10-CM | POA: Diagnosis not present

## 2019-12-24 DIAGNOSIS — D225 Melanocytic nevi of trunk: Secondary | ICD-10-CM | POA: Diagnosis not present

## 2020-04-05 DIAGNOSIS — R300 Dysuria: Secondary | ICD-10-CM | POA: Diagnosis not present

## 2020-04-07 ENCOUNTER — Other Ambulatory Visit: Payer: Self-pay

## 2020-04-07 ENCOUNTER — Ambulatory Visit
Admission: RE | Admit: 2020-04-07 | Discharge: 2020-04-07 | Disposition: A | Discharge status: Home / Self Care | Payer: Medicare HMO | Source: Ambulatory Visit | Attending: Family | Admitting: Family

## 2020-04-07 DIAGNOSIS — Z1231 Encounter for screening mammogram for malignant neoplasm of breast: Secondary | ICD-10-CM

## 2020-05-10 DIAGNOSIS — G039 Meningitis, unspecified: Secondary | ICD-10-CM | POA: Diagnosis not present

## 2020-05-10 DIAGNOSIS — S24109S Unspecified injury at unspecified level of thoracic spinal cord, sequela: Secondary | ICD-10-CM | POA: Diagnosis not present

## 2020-05-10 DIAGNOSIS — M961 Postlaminectomy syndrome, not elsewhere classified: Secondary | ICD-10-CM | POA: Diagnosis not present

## 2022-06-28 ENCOUNTER — Other Ambulatory Visit: Payer: Self-pay | Admitting: Urology

## 2022-08-09 ENCOUNTER — Encounter (HOSPITAL_BASED_OUTPATIENT_CLINIC_OR_DEPARTMENT_OTHER): Payer: Self-pay | Admitting: Urology

## 2022-08-10 ENCOUNTER — Encounter (HOSPITAL_BASED_OUTPATIENT_CLINIC_OR_DEPARTMENT_OTHER): Payer: Self-pay | Admitting: Urology

## 2022-08-10 NOTE — Progress Notes (Signed)
Spoke w/ via phone for pre-op interview--- pt Lab needs dos----   istat, EKG           Lab results------ no COVID test -----patient states asymptomatic no test needed Arrive at ------- 0800 on 08-14-2022 NPO after MN NO Solid Food.  Clear liquids from MN until--- 0700 Med rec completed Medications to take morning of surgery ----- baclofen, ms contin, nucynta, synthroid Diabetic medication ----- n/a Patient instructed no nail polish to be worn day of surgery Patient instructed to bring photo id and insurance card day of surgery Patient aware to have Driver (ride ) / caregiver    for 24 hours after surgery -- mother, donna Patient Special Instructions ----- n/a Pre-Op special Istructions -----  although pt is paraplegic, does walk with walker and long distance uses wheelchair Patient verbalized understanding of instructions that were given at this phone interview. Patient denies shortness of breath, chest pain, fever, cough at this phone interview.

## 2022-08-14 ENCOUNTER — Encounter (HOSPITAL_BASED_OUTPATIENT_CLINIC_OR_DEPARTMENT_OTHER): Payer: Self-pay | Admitting: Urology

## 2022-08-14 ENCOUNTER — Ambulatory Visit (HOSPITAL_BASED_OUTPATIENT_CLINIC_OR_DEPARTMENT_OTHER): Payer: 59 | Admitting: Anesthesiology

## 2022-08-14 ENCOUNTER — Encounter (HOSPITAL_BASED_OUTPATIENT_CLINIC_OR_DEPARTMENT_OTHER)
Admission: RE | Disposition: A | Discharge status: Home / Self Care | Payer: Self-pay | Source: Home / Self Care | Attending: Urology

## 2022-08-14 ENCOUNTER — Ambulatory Visit (HOSPITAL_BASED_OUTPATIENT_CLINIC_OR_DEPARTMENT_OTHER)
Admission: RE | Admit: 2022-08-14 | Discharge: 2022-08-14 | Disposition: A | Discharge status: Home / Self Care | Payer: 59 | Attending: Urology | Admitting: Urology

## 2022-08-14 DIAGNOSIS — I10 Essential (primary) hypertension: Secondary | ICD-10-CM

## 2022-08-14 DIAGNOSIS — N3289 Other specified disorders of bladder: Secondary | ICD-10-CM | POA: Insufficient documentation

## 2022-08-14 DIAGNOSIS — N3941 Urge incontinence: Secondary | ICD-10-CM | POA: Insufficient documentation

## 2022-08-14 DIAGNOSIS — N3281 Overactive bladder: Secondary | ICD-10-CM

## 2022-08-14 DIAGNOSIS — R519 Headache, unspecified: Secondary | ICD-10-CM | POA: Diagnosis not present

## 2022-08-14 DIAGNOSIS — Z79899 Other long term (current) drug therapy: Secondary | ICD-10-CM | POA: Diagnosis not present

## 2022-08-14 DIAGNOSIS — Z79891 Long term (current) use of opiate analgesic: Secondary | ICD-10-CM | POA: Diagnosis not present

## 2022-08-14 DIAGNOSIS — Z792 Long term (current) use of antibiotics: Secondary | ICD-10-CM | POA: Insufficient documentation

## 2022-08-14 DIAGNOSIS — Z01818 Encounter for other preprocedural examination: Secondary | ICD-10-CM

## 2022-08-14 DIAGNOSIS — N3020 Other chronic cystitis without hematuria: Secondary | ICD-10-CM | POA: Diagnosis not present

## 2022-08-14 DIAGNOSIS — R569 Unspecified convulsions: Secondary | ICD-10-CM | POA: Insufficient documentation

## 2022-08-14 HISTORY — DX: Other specified health status: Z78.9

## 2022-08-14 HISTORY — DX: Meningitis, unspecified: G03.9

## 2022-08-14 HISTORY — DX: Localized edema: R60.0

## 2022-08-14 HISTORY — DX: Migraine, unspecified, not intractable, without status migrainosus: G43.909

## 2022-08-14 HISTORY — DX: Opioid dependence, uncomplicated: F11.20

## 2022-08-14 HISTORY — PX: CYSTOSCOPY: SHX5120

## 2022-08-14 HISTORY — PX: BOTOX INJECTION: SHX5754

## 2022-08-14 LAB — POCT I-STAT, CHEM 8
BUN: 25 mg/dL — ABNORMAL HIGH (ref 6–20)
Calcium, Ion: 1.25 mmol/L (ref 1.15–1.40)
Chloride: 102 mmol/L (ref 98–111)
Creatinine, Ser: 0.8 mg/dL (ref 0.44–1.00)
Glucose, Bld: 88 mg/dL (ref 70–99)
HCT: 36 % (ref 36.0–46.0)
Hemoglobin: 12.2 g/dL (ref 12.0–15.0)
Potassium: 4 mmol/L (ref 3.5–5.1)
Sodium: 141 mmol/L (ref 135–145)
TCO2: 29 mmol/L (ref 22–32)

## 2022-08-14 SURGERY — CYSTOSCOPY
Anesthesia: Monitor Anesthesia Care

## 2022-08-14 MED ORDER — ONDANSETRON HCL 4 MG/2ML IJ SOLN
INTRAMUSCULAR | Status: AC
  Filled 2022-08-14: qty 2

## 2022-08-14 MED ORDER — CIPROFLOXACIN IN D5W 400 MG/200ML IV SOLN: 400.0000 mg | INTRAVENOUS | Status: DC

## 2022-08-14 MED ORDER — PROPOFOL 10 MG/ML IV BOLUS
INTRAVENOUS | Status: DC | PRN
  Administered 2022-08-14: 30 mg via INTRAVENOUS

## 2022-08-14 MED ORDER — SODIUM CHLORIDE (PF) 0.9 % IJ SOLN
INTRAMUSCULAR | Status: DC | PRN
  Administered 2022-08-14: 20 mL

## 2022-08-14 MED ORDER — LACTATED RINGERS IV SOLN: INTRAVENOUS | Status: DC

## 2022-08-14 MED ORDER — SODIUM CHLORIDE 0.9 % IV SOLN
INTRAVENOUS | Status: AC
  Filled 2022-08-14: qty 100

## 2022-08-14 MED ORDER — ONABOTULINUMTOXINA 100 UNITS IJ SOLR
INTRAMUSCULAR | Status: DC | PRN
  Administered 2022-08-14: 200 [IU]

## 2022-08-14 MED ORDER — CIPROFLOXACIN IN D5W 400 MG/200ML IV SOLN
INTRAVENOUS | Status: AC
  Filled 2022-08-14: qty 200

## 2022-08-14 MED ORDER — LIDOCAINE HCL (PF) 2 % IJ SOLN
INTRAMUSCULAR | Status: AC
  Filled 2022-08-14: qty 5

## 2022-08-14 MED ORDER — PROPOFOL 10 MG/ML IV BOLUS
INTRAVENOUS | Status: AC
  Filled 2022-08-14: qty 20

## 2022-08-14 MED ORDER — MIDAZOLAM HCL 2 MG/2ML IJ SOLN
INTRAMUSCULAR | Status: AC
  Filled 2022-08-14: qty 2

## 2022-08-14 MED ORDER — FENTANYL CITRATE (PF) 100 MCG/2ML IJ SOLN
INTRAMUSCULAR | Status: AC
  Filled 2022-08-14: qty 2

## 2022-08-14 MED ORDER — PROPOFOL 500 MG/50ML IV EMUL
INTRAVENOUS | Status: DC | PRN
  Administered 2022-08-14: 225 ug/kg/min via INTRAVENOUS

## 2022-08-14 MED ORDER — VANCOMYCIN HCL 500 MG IV SOLR
INTRAVENOUS | Status: AC
  Filled 2022-08-14: qty 10

## 2022-08-14 MED ORDER — DEXAMETHASONE SODIUM PHOSPHATE 10 MG/ML IJ SOLN
INTRAMUSCULAR | Status: AC
  Filled 2022-08-14: qty 1

## 2022-08-14 MED ORDER — ACETAMINOPHEN 500 MG PO TABS
ORAL_TABLET | ORAL | Status: AC
  Filled 2022-08-14: qty 2

## 2022-08-14 MED ORDER — SODIUM CHLORIDE 0.9 % IR SOLN
Status: DC | PRN
  Administered 2022-08-14: 3000 mL

## 2022-08-14 MED ORDER — ONDANSETRON HCL 4 MG/2ML IJ SOLN
INTRAMUSCULAR | Status: DC | PRN
  Administered 2022-08-14: 4 mg via INTRAVENOUS

## 2022-08-14 MED ORDER — VANCOMYCIN HCL 500 MG IV SOLR
500.0000 mg | Freq: Once | INTRAVENOUS | Status: AC
  Administered 2022-08-14: 500 mg via INTRAVENOUS

## 2022-08-14 MED ORDER — ACETAMINOPHEN 500 MG PO TABS
1000.0000 mg | ORAL_TABLET | Freq: Once | ORAL | Status: AC
  Administered 2022-08-14: 1000 mg via ORAL

## 2022-08-14 SURGICAL SUPPLY — 19 items
BAG DRAIN URO-CYSTO SKYTR STRL (DRAIN) ×1 IMPLANT
BAG DRN UROCATH (DRAIN) ×1
CLOTH BEACON ORANGE TIMEOUT ST (SAFETY) ×1 IMPLANT
GLOVE BIO SURGEON STRL SZ7.5 (GLOVE) ×1 IMPLANT
GLOVE SURG SS PI 6.5 STRL IVOR (GLOVE) IMPLANT
GOWN STRL REUS W/ TWL LRG LVL3 (GOWN DISPOSABLE) IMPLANT
GOWN STRL REUS W/TWL LRG LVL3 (GOWN DISPOSABLE) ×1
GOWN STRL REUS W/TWL XL LVL3 (GOWN DISPOSABLE) ×1 IMPLANT
IV NS IRRIG 3000ML ARTHROMATIC (IV SOLUTION) IMPLANT
KIT TURNOVER CYSTO (KITS) ×1 IMPLANT
MANIFOLD NEPTUNE II (INSTRUMENTS) ×1 IMPLANT
NDL ASPIRATION 22 (NEEDLE) IMPLANT
NDL SAFETY ECLIP 18X1.5 (MISCELLANEOUS) IMPLANT
NEEDLE ASPIRATION 22 (NEEDLE) ×1 IMPLANT
PACK CYSTO (CUSTOM PROCEDURE TRAY) ×1 IMPLANT
SLEEVE SCD COMPRESS KNEE MED (STOCKING) ×1 IMPLANT
SYR 10ML LL (SYRINGE) IMPLANT
SYR CONTROL 10ML LL (SYRINGE) IMPLANT
TUBE CONNECTING 12X1/4 (SUCTIONS) IMPLANT

## 2022-08-14 NOTE — Anesthesia Preprocedure Evaluation (Addendum)
Anesthesia Evaluation  Patient identified by MRN, date of birth, ID band Patient awake    Reviewed: Allergy & Precautions, NPO status , Patient's Chart, lab work & pertinent test results  Airway Mallampati: I  TM Distance: >3 FB Neck ROM: Full    Dental no notable dental hx. (+) Teeth Intact, Dental Advisory Given   Pulmonary neg pulmonary ROS   Pulmonary exam normal breath sounds clear to auscultation       Cardiovascular hypertension, Normal cardiovascular exam Rhythm:Regular Rate:Normal     Neuro/Psych  Headaches, Seizures -, Well Controlled,  Lower paraplegia Thoracic epidural hematoma  negative psych ROS   GI/Hepatic negative GI ROS,,,(+)     substance abuse    Endo/Other  negative endocrine ROS    Renal/GU negative Renal ROS   Neurogenic bladder    Musculoskeletal  (+)  narcotic dependent  Abdominal   Peds  Hematology negative hematology ROS (+)   Anesthesia Other Findings   Reproductive/Obstetrics                             Anesthesia Physical Anesthesia Plan  ASA: 3  Anesthesia Plan: MAC   Post-op Pain Management: Tylenol PO (pre-op)*   Induction: Intravenous  PONV Risk Score and Plan: 2 and Propofol infusion  Airway Management Planned: Natural Airway and Simple Face Mask  Additional Equipment:   Intra-op Plan:   Post-operative Plan:   Informed Consent: I have reviewed the patients History and Physical, chart, labs and discussed the procedure including the risks, benefits and alternatives for the proposed anesthesia with the patient or authorized representative who has indicated his/her understanding and acceptance.     Dental advisory given  Plan Discussed with: CRNA  Anesthesia Plan Comments:        Anesthesia Quick Evaluation

## 2022-08-14 NOTE — H&P (Signed)
Because of insurance reasons patient had 200 units of Botox in the hospital. She went back on daily Keflex. She does have compliance issues.   Patient thinks is working. She was burning yesterday and today. She has not started the Keflex. She want to take Diflucan for yeast infection 1st. Once again I think she has a significant compliance issue and this history keeps repeating itself   The Myrbetriq works the best but 100 dollars. I believe she has failed VESIcare and the skin patch in the past.   Patient is on daily Keflex and I am not going to change this. We have discussed prophylaxis in the past and she has tried others. She still on oxybutynin twice a day and Myrbetriq at bedtime. Some samples were given. She is doing quite well not wearing pads during the day but now she has bedwetting. She has foul-smelling urine. She is having spasms. I do not plan on treating her with self-treatment monuroldaches with Macrobid and trimethoprim did not work according to my medical records   the patient tends to control her care and we came up with the following plan. I renewed her oxybutynin and Myrbetriq. I renewed her Keflex. I called in ciprofloxacin. She will then go back on daily Keflex. Bedwetting continues she will call and schedule Botox 200 units under anesthesia   TOday  Frequency stable. We have been using 200 units of Botox. Last culture a year ago negative.  Patient is an oxybutynin twice a day. Unfortunately the patient's insurance no longer covers the Myrbetriq. The combination was working dramatically better than oxybutynin as a monotherapy. She has failed other anti muscarinic. She may have had 4 breakthrough infections on daily Keflex and she still on it. She tends to get yeast infections when treated. She just finished [place] a week ago and does not think she is clinically infected today   Oxybutynin twice a day for 3 months and 3 refills sent. Two months of Myrbetriq samples given. I  will call in Diflucan 100 mg daily for 3 days with 2 refills. She would like to try do Botox in the office again and I am not sure if we can be able to get this patient for but we will try it 200 units which is and on label use for neurogenic bladder overactivity and refractory urgency incontinence. If not I would need to be done in the operating room.   Urine culture prior as per protocol  Keflex also renewed.  I am not sure why her insurance stopped covering Myrbetriq  we gave her a urine specimen jar if she wants to bring a culture in 10 days prior to the Botox for convenience   Patient catheterizes 6 times a day   Today  Have not seen the patient since August 2021. Saw my partner for possible kidney stone in 2022. She is on chronic pain medicine for back pain. No stone on CT but was constipated.  In the last few days very foul-smelling urine. Presentation is almost identical to the above. She is not compliant with daily Keflex. She just started again with a foul-smelling urine. She is clinically infected. I called in ciprofloxacin 250 mg twice a day for 1 week. I called in Diflucan 100 mg daily for 3 days with 2 refills. I also renewed her oxybutynin. I renewed her Keflex. She wants to schedule Botox which needs to be done in the operating room since they will not cover it in the office.  I also renewed the Myrbetriq  We will use 200 units and will get a culture 10 days prior     ALLERGIES: Buprenorphine Hcl buPROPion CeleBREX CAPS Celecoxib Chlorhexidine Gluconate SOLN Codeine Derivatives Doxycycline Erythromycin Sulfa Drugs Topamax topiramate Wellbutrin    MEDICATIONS: Adderall XR CP24 Oral  Atenolol-Chlorthalidone  Baclofen 20 mg tablet Oral  Cymbalta 60 mg capsule,delayed release  Cymbalta 30 mg capsule,delayed release  Depakote TBEC Oral  Ibuprofen 800 MG Oral Tablet Oral  Klor-Con M10 10 meq tablet, ext release, particles/crystals  Ms Contin 15 mg tablet, extended  release Oral  Nucynta 100 mg tablet  Phenergan TABS Oral  Protonix 40 mg tablet, delayed release  Sumatriptan Succinate 6 mg/0.5 ml cartridge Subcutaneous  Trazodone Hcl 100 mg tablet  Xanax 0.5 MG Oral Tablet Oral  Zanaflex CAPS Oral  Zofran TABS Oral     GU PSH: Cystoscopy Hydrodistention - 2009 Cystourethroscopy, W/Injection For Chemodenervation Of Bladder - 2019, 2018 Urethrolysis - 2015, 2012, 2011, 2010       [facility] Notes: Urologic Surgery, Urologic Surgery, Urologic Surgery, Urologic Surgery, Cystoscopy With Dilation Of Bladder   NON-GU PSH: None   GU PMH: Flank Pain - 12/12/2020 Bladder, Neuromuscular dysfunction, Unspec - 2019, Neurogenic bladder, - 2016 Chronic cystitis (w/o hematuria), Chronic cystitis - 2016 Urge incontinence, Urge incontinence of urine - 2015 Abdominal Pain Unspec, Abdominal pain - 2014 Dorsalgia, Unspec, Backache - 2014 Gross hematuria, Gross hematuria - 2014 Urinary Frequency, Increased urinary frequency - 2014 Urinary Retention, Unspec, Incomplete bladder emptying - 2014 Urinary Tract Inf, Unspec site, Urinary tract infection - 2014      PMH Notes:  2006-05-28 11:17:06 - Note: Anxiety  2006-05-28 11:17:06 - Note: Recent Methicillin-resistant Staphylococcus Aureus Infection   NON-GU PMH: Constipation, unspecified - 12/12/2020 Encounter for general adult medical examination without abnormal findings, Encounter for preventive health examination - 2016 Personal history of other (healed) physical injury and trauma, History of spinal cord injury - 2014 Personal history of other mental and behavioral disorders, History of depression - 2014 Seizure disorder, Convulsions (As Sx) - 2014    FAMILY HISTORY: Acute Myocardial Infarction - Father, Grandmother Brain tumor - Grandfather, Uncle Breast Cancer - Mother Family Health Status Number - Runs In Family Urologic Disorder - Father   SOCIAL HISTORY: Marital Status: Married Preferred Language:  English; Ethnicity: Not Hispanic Or Latino; Race: White Current Smoking Status: Patient has never smoked.   Tobacco Use Assessment Completed: Used Tobacco in last 30 days? Has never drank.  Drinks 2 caffeinated drinks per day. Has had a blood transfusion.    REVIEW OF SYSTEMS:    GU Review Female:   Patient denies frequent urination, hard to postpone urination, burning /pain with urination, get up at night to urinate, leakage of urine, stream starts and stops, trouble starting your stream, have to strain to urinate, and being pregnant.  Gastrointestinal (Upper):   Patient denies nausea, vomiting, and indigestion/ heartburn.  Gastrointestinal (Lower):   Patient denies diarrhea and constipation.  Constitutional:   Patient denies fever, fatigue, night sweats, and weight loss.  Skin:   Patient denies skin rash/ lesion and itching.  Eyes:   Patient denies blurred vision and double vision.  Ears/ Nose/ Throat:   Patient denies sore throat and sinus problems.  Hematologic/Lymphatic:   Patient denies swollen glands and easy bruising.  Cardiovascular:   Patient denies leg swelling and chest pains.  Respiratory:   Patient denies cough and shortness of breath.  Endocrine:   Patient denies  excessive thirst.  Musculoskeletal:   Patient denies back pain and joint pain.  Neurological:   Patient denies headaches and dizziness.  Psychologic:   Patient denies depression and anxiety.   VITAL SIGNS:      06/22/2022 03:11 PM  BP 144/87 mmHg  Pulse 77 /min  Temperature 96.5 F / 35.8 C   PAST DATA REVIEW: None   PROCEDURES:          Urinalysis w/Scope Dipstick Dipstick Cont'd Micro  Color: Yellow Bilirubin: Neg mg/dL WBC/hpf: 6 - 10/hpf  Appearance: Slightly Cloudy Ketones: Neg mg/dL RBC/hpf: NS (Not Seen)  Specific Gravity: 1.025 Blood: Neg ery/uL Bacteria: Mod (26-50/hpf)  pH: 6.5 Protein: Trace mg/dL Cystals: NS (Not Seen)  Glucose: Neg mg/dL Urobilinogen: 1.0 mg/dL Casts: NS (Not Seen)     Nitrites: Neg Trichomonas: Not Present    Leukocyte Esterase: 1+ leu/uL Mucous: Not Present      Epithelial Cells: 10 - 20/hpf      Yeast: NS (Not Seen)      Sperm: Not Present    ASSESSMENT:      ICD-10 Details  1 GU:   Chronic cystitis (w/o hematuria) - N30.20   2   Bladder, Neuromuscular dysfunction, Unspec - N31.9      PLAN:            Medications New Meds: Cephalexin 250 mg tablet 1 tablet PO Daily   #90  3 Refill(s)  Cipro 250 mg tablet 1 tablet PO BID   #14  0 Refill(s)  Myrbetriq 50 mg tablet, extended release 24 hr 1 tablet PO Daily   #90  3 Refill(s)  Oxybutynin Chloride Er 10 mg tablet, extended release 24 hr 1 tablet PO Daily   #3  0 Refill(s)  Fluconazole 100 mg tablet 1 tablet PO Daily   #3  3 Refill(s)  Pharmacy Name:  [id number] Address:  [address] [place] [address] Phone:  [phone] Fax:  [phone]           Schedule Return Visit/Planned Activity: 1 Year - Office Visit

## 2022-08-14 NOTE — Interval H&P Note (Signed)
History and Physical Interval Note:  08/14/2022 10:07 AM  Melissa Mills  has presented today for surgery, with the diagnosis of REFRACTORY URGENCY INCONTINENCE.  The various methods of treatment have been discussed with the patient and family. After consideration of risks, benefits and other options for treatment, the patient has consented to  Procedure(s) with comments: CYSTOSCOPY (N/A) CYSTOSCOPY BOTOX INJECTION (N/A) - 30 MINS FRO CASE as a surgical intervention.  The patient's history has been reviewed, patient examined, no change in status, stable for surgery.  I have reviewed the patient's chart and labs.  Questions were answered to the patient's satisfaction.     Athalie Newhard A Alaiyah Bollman

## 2022-08-14 NOTE — Anesthesia Postprocedure Evaluation (Signed)
Anesthesia Post Note  Patient: Melissa Mills  Procedure(s) Performed: CYSTOSCOPY CYSTOSCOPY BOTOX INJECTION     Patient location during evaluation: PACU Anesthesia Type: General Level of consciousness: awake and alert Pain management: pain level controlled Vital Signs Assessment: post-procedure vital signs reviewed and stable Respiratory status: spontaneous breathing, nonlabored ventilation, respiratory function stable and patient connected to nasal cannula oxygen Cardiovascular status: blood pressure returned to baseline and stable Postop Assessment: no apparent nausea or vomiting Anesthetic complications: no  No notable events documented.  Last Vitals:  Vitals:   08/14/22 1115 08/14/22 1130  BP: 110/65 103/74  Pulse: 66 63  Resp: 10 18  Temp:    SpO2: 98% 95%    Last Pain:  Vitals:   08/14/22 1115  TempSrc:   PainSc: 0-No pain                 Shaquill Iseman L Abhi Moccia

## 2022-08-14 NOTE — Discharge Instructions (Addendum)
I have reviewed discharge instructions in detail with the patient. They will follow-up with me or their physician as scheduled. My nurse will also be calling the patients as per protocol.   CYSTOSCOPY HOME CARE INSTRUCTIONS  Activity: Rest for the remainder of the day.  Do not drive or operate equipment today.  You may resume normal activities in one to two days as instructed by your physician.   Meals: Drink plenty of liquids and eat light foods such as gelatin or soup this evening.  You may return to a normal meal plan tomorrow.  Return to Work: You may return to work in one to two days or as instructed by your physician.  Special Instructions / Symptoms: Call your physician if any of these symptoms occur:   -persistent or heavy bleeding  -bleeding which continues after first few urination  -large blood clots that are difficult to pass  -urine stream diminishes or stops completely  -fever equal to or higher than 101 degrees Farenheit.  -cloudy urine with a strong, foul odor  -severe pain  Females should always wipe from front to back after elimination.  You may feel some burning pain when you urinate.  This should disappear with time.  Applying moist heat to the lower abdomen or a hot tub bath may help relieve the pain.    Activity: Rest for the remainder of the day.  Do not drive or operate equipment today.  You may resume normal activities in one to two days as instructed by your physician.   Meals: Drink plenty of liquids and eat light foods such as gelatin or soup this evening.  You may return to a normal meal plan tomorrow.  Return to Work: You may return to work in one to two days or as instructed by your physician.  Special Instructions / Symptoms: Call your physician if any of these symptoms occur:   -persistent or heavy bleeding  -bleeding which continues after first few urination  -large blood clots that are difficult to pass  -urine stream diminishes or stops  completely  -fever equal to or higher than 101 degrees Farenheit.  -cloudy urine with a strong, foul odor  -severe pain  Females should always wipe from front to back after elimination.  You may feel some burning pain when you urinate.  This should disappear with time.  Applying moist heat to the lower abdomen or a hot tub bath may help relieve the pain. \    Activity: Get plenty of rest for the remainder of the day. A responsible individual must stay with you for 24 hours following the procedure.  For the next 24 hours, DO NOT: -Drive a car -Paediatric nurse -Drink alcoholic beverages -Take any medication unless instructed by your physician -Make any legal decisions or sign important papers.  Meals: Start with liquid foods such as gelatin or soup. Progress to regular foods as tolerated. Avoid greasy, spicy, heavy foods. If nausea and/or vomiting occur, drink only clear liquids until the nausea and/or vomiting subsides. Call your physician if vomiting continues.  Special Instructions/Symptoms: Your throat may feel dry or sore from the anesthesia or the breathing tube placed in your throat during surgery. If this causes discomfort, gargle with warm salt water. The discomfort should disappear within 24 hours.  You had tylenol/acetaminophen at 0850 today, 08/14/22. You can safely take that medicine again at 3pm today if needed for discomfort.

## 2022-08-14 NOTE — Transfer of Care (Signed)
Immediate Anesthesia Transfer of Care Note  Patient: Melissa Mills  Procedure(s) Performed: CYSTOSCOPY CYSTOSCOPY BOTOX INJECTION  Patient Location: PACU  Anesthesia Type:MAC  Level of Consciousness: awake, alert , oriented, and patient cooperative  Airway & Oxygen Therapy: Patient Spontanous Breathing  Post-op Assessment: Report given to RN and Post -op Vital signs reviewed and stable  Post vital signs: Reviewed and stable  Last Vitals:  Vitals Value Taken Time  BP 103/59 08/14/22 1052  Temp    Pulse 71 08/14/22 1056  Resp 17 08/14/22 1056  SpO2 100 % 08/14/22 1056  Vitals shown include unvalidated device data.  Last Pain:  Vitals:   08/14/22 0844  TempSrc: Oral  PainSc: 4       Patients Stated Pain Goal: 1 ([id number])  Complications: No notable events documented.

## 2022-08-14 NOTE — Op Note (Signed)
Preoperative diagnosis: Refractory urgency incontinence and neurogenic detrusor overactivity Postoperative diagnosis: Refractory urgency incontinence and degenerative detrusor overactivity Surgery: Cystoscopy and injection of botulinum toxin Surgeon: Dr. Nicki Reaper McDiarmid  Patient has the above diagnoses and consented above procedure.  She was given preoperative antibiotics.  She clinically was not infected preoperatively.  Patient received IV sedation discussed prior to procedure.  ACMI scope was utilized.  She grade 2-4 bladder trabeculation.  Bladder mucosa and trigone otherwise are normal.  I injected 200 units of Botox and 20 cc in normal saline with 10 injections in the lower third of the bladder.  There is no bleeding  Bladder was partially emptied and she was taken to recovery

## 2022-08-15 ENCOUNTER — Encounter (HOSPITAL_BASED_OUTPATIENT_CLINIC_OR_DEPARTMENT_OTHER): Payer: Self-pay | Admitting: Urology
